# Patient Record
Sex: Male | Born: 1955
Health system: Southern US, Community
[De-identification: ages and names within clinical notes are randomized; demographics above are authoritative.]

## PROBLEM LIST (undated history)

## (undated) DIAGNOSIS — I10 Essential (primary) hypertension: Secondary | ICD-10-CM

## (undated) DIAGNOSIS — R931 Abnormal findings on diagnostic imaging of heart and coronary circulation: Secondary | ICD-10-CM

## (undated) DIAGNOSIS — E785 Hyperlipidemia, unspecified: Secondary | ICD-10-CM

## (undated) DIAGNOSIS — I2510 Atherosclerotic heart disease of native coronary artery without angina pectoris: Secondary | ICD-10-CM

## (undated) DIAGNOSIS — K219 Gastro-esophageal reflux disease without esophagitis: Secondary | ICD-10-CM

## (undated) DIAGNOSIS — K635 Polyp of colon: Secondary | ICD-10-CM

## (undated) HISTORY — PX: TONSILLECTOMY: SUR1361

## (undated) HISTORY — DX: Abnormal findings on diagnostic imaging of heart and coronary circulation: R93.1

## (undated) HISTORY — DX: Gastro-esophageal reflux disease without esophagitis: K21.9

## (undated) HISTORY — DX: Polyp of colon: K63.5

## (undated) HISTORY — DX: Hyperlipidemia, unspecified: E78.5

## (undated) HISTORY — DX: Atherosclerotic heart disease of native coronary artery without angina pectoris: I25.10

## (undated) HISTORY — DX: Essential (primary) hypertension: I10

---

## 1998-04-10 ENCOUNTER — Encounter: Payer: Self-pay | Admitting: Family Medicine

## 2000-03-19 ENCOUNTER — Encounter: Payer: Self-pay | Admitting: Family Medicine

## 2000-03-19 LAB — CONVERTED CEMR LAB: PSA: 1.5 ng/mL

## 2001-07-07 ENCOUNTER — Encounter: Payer: Self-pay | Admitting: Family Medicine

## 2003-12-03 ENCOUNTER — Encounter: Payer: Self-pay | Admitting: Family Medicine

## 2004-02-01 ENCOUNTER — Ambulatory Visit: Payer: Self-pay | Admitting: Family Medicine

## 2004-03-07 ENCOUNTER — Ambulatory Visit: Payer: Self-pay

## 2004-05-07 ENCOUNTER — Ambulatory Visit: Payer: Self-pay | Admitting: Family Medicine

## 2004-06-17 ENCOUNTER — Ambulatory Visit: Payer: Self-pay | Admitting: Family Medicine

## 2004-09-19 ENCOUNTER — Ambulatory Visit: Payer: Self-pay | Admitting: Family Medicine

## 2004-12-25 ENCOUNTER — Ambulatory Visit: Payer: Self-pay | Admitting: Family Medicine

## 2005-02-05 ENCOUNTER — Ambulatory Visit: Payer: Self-pay | Admitting: Family Medicine

## 2005-02-05 LAB — CONVERTED CEMR LAB: PSA: 1.98 ng/mL

## 2005-04-01 ENCOUNTER — Ambulatory Visit: Payer: Self-pay | Admitting: Family Medicine

## 2005-04-02 ENCOUNTER — Ambulatory Visit (HOSPITAL_COMMUNITY): Admission: RE | Admit: 2005-04-02 | Discharge: 2005-04-02 | Payer: Self-pay | Admitting: Family Medicine

## 2005-11-17 ENCOUNTER — Ambulatory Visit: Payer: Self-pay | Admitting: Family Medicine

## 2005-11-17 LAB — CONVERTED CEMR LAB: PSA: 2.27 ng/mL

## 2005-12-14 ENCOUNTER — Ambulatory Visit: Payer: Self-pay | Admitting: Family Medicine

## 2006-02-24 ENCOUNTER — Ambulatory Visit: Payer: Self-pay | Admitting: Family Medicine

## 2006-04-23 ENCOUNTER — Ambulatory Visit: Payer: Self-pay | Admitting: Family Medicine

## 2006-05-28 ENCOUNTER — Ambulatory Visit: Payer: Self-pay | Admitting: Family Medicine

## 2006-05-28 ENCOUNTER — Encounter: Payer: Self-pay | Admitting: Family Medicine

## 2006-05-28 DIAGNOSIS — K219 Gastro-esophageal reflux disease without esophagitis: Secondary | ICD-10-CM | POA: Insufficient documentation

## 2006-05-28 DIAGNOSIS — Z87891 Personal history of nicotine dependence: Secondary | ICD-10-CM | POA: Insufficient documentation

## 2006-05-28 DIAGNOSIS — I10 Essential (primary) hypertension: Secondary | ICD-10-CM | POA: Insufficient documentation

## 2006-05-28 DIAGNOSIS — E7800 Pure hypercholesterolemia, unspecified: Secondary | ICD-10-CM | POA: Insufficient documentation

## 2006-05-28 DIAGNOSIS — K649 Unspecified hemorrhoids: Secondary | ICD-10-CM | POA: Insufficient documentation

## 2006-05-28 LAB — CONVERTED CEMR LAB
ALT: 27 units/L (ref 0–40)
HDL: 43.6 mg/dL (ref >39.0)
LDL Cholesterol: 114 mg/dL — ABNORMAL HIGH (ref 0–99)
Total CHOL/HDL Ratio: 4.3
Triglycerides: 144 mg/dL (ref 0–149)
VLDL: 29 mg/dL (ref 0–40)

## 2006-11-22 ENCOUNTER — Ambulatory Visit: Payer: Self-pay | Admitting: Internal Medicine

## 2006-11-22 ENCOUNTER — Encounter (INDEPENDENT_AMBULATORY_CARE_PROVIDER_SITE_OTHER): Payer: Self-pay | Admitting: Internal Medicine

## 2006-11-25 ENCOUNTER — Ambulatory Visit: Payer: Self-pay | Admitting: Family Medicine

## 2008-01-11 ENCOUNTER — Ambulatory Visit: Payer: Self-pay | Admitting: Family Medicine

## 2008-01-13 LAB — CONVERTED CEMR LAB
ALT: 26 units/L (ref 0–53)
Albumin: 4.2 g/dL (ref 3.5–5.2)
Basophils Relative: 0.3 % (ref 0.0–3.0)
CO2: 30 meq/L (ref 19–32)
Calcium: 9.6 mg/dL (ref 8.4–10.5)
Cholesterol: 219 mg/dL (ref 0–200)
Creatinine, Ser: 1.2 mg/dL (ref 0.4–1.5)
Direct LDL: 129.3 mg/dL
GFR calc Af Amer: 83 mL/min
Glucose, Bld: 98 mg/dL (ref 70–99)
HCT: 42.1 % (ref 39.0–52.0)
Hemoglobin: 15.1 g/dL (ref 13.0–17.0)
Lymphocytes Relative: 31.7 % (ref 12.0–46.0)
MCHC: 35.7 g/dL (ref 30.0–36.0)
Monocytes Absolute: 0.7 10*3/uL (ref 0.1–1.0)
Monocytes Relative: 9.3 % (ref 3.0–12.0)
Neutro Abs: 4.1 10*3/uL (ref 1.4–7.7)
PSA: 3.26 ng/mL (ref 0.10–4.00)
RBC: 4.49 M/uL (ref 4.22–5.81)
RDW: 11.6 % (ref 11.5–14.6)
Sodium: 139 meq/L (ref 135–145)
TSH: 0.69 microintl units/mL (ref 0.35–5.50)
Total CHOL/HDL Ratio: 5.3
Total Protein: 6.7 g/dL (ref 6.0–8.3)
VLDL: 26 mg/dL (ref 0–40)

## 2008-02-23 ENCOUNTER — Ambulatory Visit: Payer: Self-pay | Admitting: Gastroenterology

## 2008-02-24 HISTORY — PX: COLONOSCOPY W/ POLYPECTOMY: SHX1380

## 2008-03-02 ENCOUNTER — Encounter: Payer: Self-pay | Admitting: Gastroenterology

## 2008-03-02 ENCOUNTER — Ambulatory Visit: Payer: Self-pay | Admitting: Gastroenterology

## 2008-03-06 ENCOUNTER — Encounter: Payer: Self-pay | Admitting: Gastroenterology

## 2008-03-07 ENCOUNTER — Telehealth: Payer: Self-pay | Admitting: Gastroenterology

## 2008-03-07 ENCOUNTER — Ambulatory Visit: Payer: Self-pay | Admitting: Gastroenterology

## 2008-03-07 LAB — CONVERTED CEMR LAB
Basophils Absolute: 0.1 10*3/uL (ref 0.0–0.1)
Lymphocytes Relative: 29.8 % (ref 12.0–46.0)
MCHC: 35 g/dL (ref 30.0–36.0)
Monocytes Relative: 9.6 % (ref 3.0–12.0)
Neutro Abs: 5.2 10*3/uL (ref 1.4–7.7)
Neutrophils Relative %: 56.8 % (ref 43.0–77.0)
Platelets: 228 10*3/uL (ref 150–400)
RDW: 11.7 % (ref 11.5–14.6)

## 2008-05-18 ENCOUNTER — Ambulatory Visit: Payer: Self-pay | Admitting: Family Medicine

## 2008-07-11 ENCOUNTER — Encounter (INDEPENDENT_AMBULATORY_CARE_PROVIDER_SITE_OTHER): Payer: Self-pay | Admitting: *Deleted

## 2008-08-15 ENCOUNTER — Ambulatory Visit: Payer: Self-pay | Admitting: Family Medicine

## 2008-08-16 LAB — CONVERTED CEMR LAB
Cholesterol: 215 mg/dL — ABNORMAL HIGH (ref 0–200)
Direct LDL: 140.1 mg/dL
HDL: 41.1 mg/dL (ref >39.00)
Triglycerides: 172 mg/dL — ABNORMAL HIGH (ref 0.0–149.0)
VLDL: 34.4 mg/dL (ref 0.0–40.0)

## 2008-09-03 ENCOUNTER — Ambulatory Visit: Payer: Self-pay | Admitting: Family Medicine

## 2008-10-08 ENCOUNTER — Ambulatory Visit: Payer: Self-pay | Admitting: Family Medicine

## 2008-10-11 LAB — CONVERTED CEMR LAB
ALT: 31 units/L (ref 0–53)
HDL: 39.6 mg/dL (ref >39.00)
LDL Cholesterol: 135 mg/dL — ABNORMAL HIGH (ref 0–99)
Total Bilirubin: 0.9 mg/dL (ref 0.3–1.2)
Total Protein: 6.8 g/dL (ref 6.0–8.3)
VLDL: 24.2 mg/dL (ref 0.0–40.0)

## 2008-10-25 ENCOUNTER — Encounter (INDEPENDENT_AMBULATORY_CARE_PROVIDER_SITE_OTHER): Payer: Self-pay | Admitting: *Deleted

## 2008-11-26 ENCOUNTER — Ambulatory Visit: Payer: Self-pay | Admitting: Family Medicine

## 2008-11-29 LAB — CONVERTED CEMR LAB
ALT: 31 units/L (ref 0–53)
AST: 21 units/L (ref 0–37)
Cholesterol: 171 mg/dL (ref 0–200)
HDL: 38.8 mg/dL — ABNORMAL LOW (ref >39.00)

## 2009-03-04 ENCOUNTER — Ambulatory Visit: Payer: Self-pay | Admitting: Family Medicine

## 2009-03-05 LAB — CONVERTED CEMR LAB
Cholesterol: 191 mg/dL (ref 0–200)
Total CHOL/HDL Ratio: 4
Triglycerides: 222 mg/dL — ABNORMAL HIGH (ref 0.0–149.0)
VLDL: 44.4 mg/dL — ABNORMAL HIGH (ref 0.0–40.0)

## 2009-08-19 ENCOUNTER — Ambulatory Visit: Payer: Self-pay | Admitting: Family Medicine

## 2009-08-19 LAB — CONVERTED CEMR LAB
ALT: 33 units/L (ref 0–53)
AST: 25 units/L (ref 0–37)
Albumin: 4.4 g/dL (ref 3.5–5.2)
Alkaline Phosphatase: 39 units/L (ref 39–117)
BUN: 23 mg/dL (ref 6–23)
Basophils Absolute: 0 10*3/uL (ref 0.0–0.1)
Basophils Relative: 0.5 % (ref 0.0–3.0)
Bilirubin, Direct: 0.2 mg/dL (ref 0.0–0.3)
CO2: 28 meq/L (ref 19–32)
Calcium: 9.4 mg/dL (ref 8.4–10.5)
Chloride: 103 meq/L (ref 96–112)
Cholesterol: 202 mg/dL — ABNORMAL HIGH (ref 0–200)
Creatinine, Ser: 1.1 mg/dL (ref 0.4–1.5)
Direct LDL: 124.5 mg/dL
Eosinophils Absolute: 0.3 10*3/uL (ref 0.0–0.7)
Eosinophils Relative: 2.6 % (ref 0.0–5.0)
GFR calc non Af Amer: 76.47 mL/min (ref >60)
Glucose, Bld: 97 mg/dL (ref 70–99)
HCT: 41.1 % (ref 39.0–52.0)
HDL: 40.9 mg/dL (ref >39.00)
Hemoglobin: 14.4 g/dL (ref 13.0–17.0)
Lymphocytes Relative: 23.4 % (ref 12.0–46.0)
Lymphs Abs: 2.2 10*3/uL (ref 0.7–4.0)
MCHC: 35 g/dL (ref 30.0–36.0)
MCV: 95.9 fL (ref 78.0–100.0)
Monocytes Absolute: 0.9 10*3/uL (ref 0.1–1.0)
Monocytes Relative: 9.4 % (ref 3.0–12.0)
Neutro Abs: 6.1 10*3/uL (ref 1.4–7.7)
Neutrophils Relative %: 64.1 % (ref 43.0–77.0)
PSA: 1.69 ng/mL (ref 0.10–4.00)
Platelets: 244 10*3/uL (ref 150.0–400.0)
Potassium: 4.5 meq/L (ref 3.5–5.1)
RBC: 4.29 M/uL (ref 4.22–5.81)
RDW: 12.7 % (ref 11.5–14.6)
Sodium: 139 meq/L (ref 135–145)
TSH: 0.96 microintl units/mL (ref 0.35–5.50)
Total Bilirubin: 1 mg/dL (ref 0.3–1.2)
Total CHOL/HDL Ratio: 5
Total Protein: 6.9 g/dL (ref 6.0–8.3)
Triglycerides: 169 mg/dL — ABNORMAL HIGH (ref 0.0–149.0)
VLDL: 33.8 mg/dL (ref 0.0–40.0)
WBC: 9.5 10*3/uL (ref 4.5–10.5)

## 2009-09-13 ENCOUNTER — Ambulatory Visit: Payer: Self-pay | Admitting: Family Medicine

## 2009-12-12 ENCOUNTER — Encounter: Payer: Self-pay | Admitting: Family Medicine

## 2010-01-27 ENCOUNTER — Ambulatory Visit: Payer: Self-pay | Admitting: Family Medicine

## 2010-01-27 DIAGNOSIS — R059 Cough, unspecified: Secondary | ICD-10-CM | POA: Insufficient documentation

## 2010-01-27 DIAGNOSIS — R05 Cough: Secondary | ICD-10-CM

## 2010-02-28 ENCOUNTER — Ambulatory Visit
Admission: RE | Admit: 2010-02-28 | Discharge: 2010-02-28 | Payer: Self-pay | Source: Home / Self Care | Attending: Family Medicine | Admitting: Family Medicine

## 2010-02-28 ENCOUNTER — Encounter: Payer: Self-pay | Admitting: Family Medicine

## 2010-02-28 DIAGNOSIS — R079 Chest pain, unspecified: Secondary | ICD-10-CM | POA: Insufficient documentation

## 2010-03-04 ENCOUNTER — Ambulatory Visit
Admission: RE | Admit: 2010-03-04 | Discharge: 2010-03-04 | Payer: Self-pay | Source: Home / Self Care | Attending: Cardiovascular Disease | Admitting: Cardiovascular Disease

## 2010-03-04 DIAGNOSIS — R9431 Abnormal electrocardiogram [ECG] [EKG]: Secondary | ICD-10-CM | POA: Insufficient documentation

## 2010-03-05 ENCOUNTER — Encounter: Payer: Self-pay | Admitting: Cardiovascular Disease

## 2010-03-11 ENCOUNTER — Encounter (INDEPENDENT_AMBULATORY_CARE_PROVIDER_SITE_OTHER): Payer: Self-pay | Admitting: *Deleted

## 2010-03-18 ENCOUNTER — Ambulatory Visit
Admission: RE | Admit: 2010-03-18 | Discharge: 2010-03-18 | Payer: Self-pay | Source: Home / Self Care | Attending: Family Medicine | Admitting: Family Medicine

## 2010-03-18 ENCOUNTER — Other Ambulatory Visit: Payer: Self-pay | Admitting: Family Medicine

## 2010-03-18 LAB — AST: AST: 25 U/L (ref 0–37)

## 2010-03-18 LAB — LIPID PANEL
LDL Cholesterol: 115 mg/dL — ABNORMAL HIGH (ref 0–99)
Total CHOL/HDL Ratio: 5
Triglycerides: 185 mg/dL — ABNORMAL HIGH (ref 0.0–149.0)

## 2010-03-24 ENCOUNTER — Ambulatory Visit
Admission: RE | Admit: 2010-03-24 | Discharge: 2010-03-24 | Payer: Self-pay | Source: Home / Self Care | Attending: Family Medicine | Admitting: Family Medicine

## 2010-03-24 ENCOUNTER — Telehealth: Payer: Self-pay | Admitting: Cardiovascular Disease

## 2010-03-25 ENCOUNTER — Telehealth: Payer: Self-pay | Admitting: Family Medicine

## 2010-03-25 NOTE — Assessment & Plan Note (Signed)
Summary: 3 months follow up /lsf   Vital Signs:  Patient profile:   55 year old male Weight:      233 pounds BMI:     45.67 Temp:     97.5 degrees F oral Pulse rate:   72 / minute Pulse rhythm:   regular BP sitting:   132 / 80  (left arm) Cuff size:   large  Vitals Entered By: Lowella Petties CMA (March 04, 2009 8:19 AM) CC: 3 month follow up   History of Present Illness: here for f/u for HTN and lipids has been feeling ok overall   working a lot - out on the road  still nervous about job security - and economy problems   will do labs today-- could not come on friday had imp with pravachol- in fall with LDL 111  diet - has been  a little better (still hard when he is traveling on the road) is avoiding greasy foods/ beef and fast food -- is doing well with that most of the time picks grilled items and salads   some exercise when he can / when he is home-- esp on the weekends   bp is well controlled -- 132/80 today  no ha or other symptoms - no changes at all  tries to walk for exercise  needs px written for flex care spending- asa and zantac (no problems with those)     Allergies: 1)  ! Lipitor 2)  ! Mentax 3)  ! * Chantix  Past History:  Past Medical History: Last updated: 03/17/2008 GERD Hypertension hyperlipidemia  hemorroids  colon polyp/ non adenoma  Past Surgical History: Last updated: 03/17/2008 Stress myoview negative 1/06 ABI's normal 2/07 colonoscopy polyp (non adenoma) (1/10)- re check 10 y  Family History: Last updated: 05/28/2006 strong fam hx of CAD father died of CAD, DM cousin-sudden death (?cardiac)  Social History: Last updated: 01/11/2008 quit smoking 1/07 Patient is a former smoker.  married  regular exercise at gym   Risk Factors: Smoking Status: quit (05/28/2006)  Review of Systems General:  Denies fatigue, fever, loss of appetite, and malaise. Eyes:  Denies blurring. CV:  Denies chest pain or discomfort and  palpitations. Resp:  Denies cough and shortness of breath. GI:  Denies change in bowel habits. MS:  Complains of joint pain; denies muscle aches; still struggling with foot problems- on mobic for that . Derm:  Denies poor wound healing and rash. Neuro:  Denies headaches, numbness, and tingling. Psych:  mood is ok- but frustrated with work at times. Endo:  Denies cold intolerance, excessive thirst, excessive urination, and heat intolerance. Heme:  Denies abnormal bruising and bleeding.  Physical Exam  General:  overweight but generally well appearing  Head:  normocephalic, atraumatic, and no abnormalities observed.   Eyes:  vision grossly intact, pupils equal, pupils round, and pupils reactive to light.   Neck:  supple with full rom and no masses or thyromegally, no JVD or carotid bruit  Lungs:  Normal respiratory effort, chest expands symmetrically. Lungs are clear to auscultation, no crackles or wheezes. Heart:  Normal rate and regular rhythm. S1 and S2 normal without gallop, murmur, click, rub or other extra sounds. Msk:  No deformity or scoliosis noted of thoracic or lumbar spine.   Pulses:  R and L carotid,radial,femoral,dorsalis pedis and posterior tibial pulses are full and equal bilaterally Extremities:  No clubbing, cyanosis, edema, or deformity noted with normal full range of motion of all joints.  Neurologic:  sensation intact to light touch, gait normal, and DTRs symmetrical and normal.   Skin:  Intact without suspicious lesions or rashes ruddy complexion baseline Cervical Nodes:  No lymphadenopathy noted Psych:  normal affect, talkative and pleasant    Impression & Recommendations:  Problem # 1:  HYPERCHOLESTEROLEMIA (ICD-272.0) Assessment Unchanged  suspect will be stable to slt imp- with better diet disc sat fats in diet  plan to continue pravachol f/u 6 mo  His updated medication list for this problem includes:    Pravachol 20 Mg Tabs (Pravastatin sodium) .Marland Kitchen...  Take one by mouth daily  Orders: Venipuncture (32355) TLB-Lipid Panel (80061-LIPID) TLB-AST (SGOT) (84450-SGOT) TLB-ALT (SGPT) (84460-ALT)  Labs Reviewed: SGOT: 21 (11/26/2008)   SGPT: 31 (11/26/2008)   HDL:38.80 (11/26/2008), 39.60 (10/08/2008)  LDL:111 (11/26/2008), 135 (10/08/2008)  Chol:171 (11/26/2008), 199 (10/08/2008)  Trig:107.0 (11/26/2008), 121.0 (10/08/2008)  Problem # 2:  HYPERTENSION (ICD-401.9) Assessment: Unchanged  bp remains in fair control with ziac  disc lifestyle habits- to fit in exercise when he can  f/u for PE and lab in 6 mo  His updated medication list for this problem includes:    Ziac 2.5-6.25 Mg Tabs (Bisoprolol-hydrochlorothiazide) .Marland Kitchen... Take 1 tablet by mouth once a day  BP today: 132/80 Prior BP: 120/84 (09/03/2008)  Labs Reviewed: K+: 4.2 (01/11/2008) Creat: : 1.2 (01/11/2008)   Chol: 171 (11/26/2008)   HDL: 38.80 (11/26/2008)   LDL: 111 (11/26/2008)   TG: 107.0 (11/26/2008)  Complete Medication List: 1)  Ziac 2.5-6.25 Mg Tabs (Bisoprolol-hydrochlorothiazide) .... Take 1 tablet by mouth once a day 2)  Aspirin 325 Mg Tabs (Aspirin) .Marland Kitchen.. 1 by mouth once daily with food 3)  Zantac 75 75 Mg Tabs (Ranitidine hcl) .... Take 1 tablet by mouth once a day 4)  Vitamin C 1000 Mg Tabs (Ascorbic acid) .... Take 1 tablet by mouth once a day 5)  Fish Oil Oil (Fish oil) .... Take 1 tablet by mouth once a day 6)  Meloxicam 15 Mg Tabs (Meloxicam) .... Take 1 tablet by mouth once a day as needed 7)  Pravachol 20 Mg Tabs (Pravastatin sodium) .... Take one by mouth daily  Patient Instructions: 1)  keep working on healthy diet and exercise  2)  labs today  3)  no change in medicines  4)  schedule fasting labs and then f/u for PE in 6 months wellness/ lipid/psa v70.0 Prescriptions: ZANTAC 75 75 MG  TABS (RANITIDINE HCL) Take 1 tablet by mouth once a day  #60 x 11   Entered and Authorized by:   Judith Part MD   Signed by:   Judith Part MD on 03/04/2009    Method used:   Print then Give to Patient   RxID:   [id number] ASPIRIN 325 MG TABS (ASPIRIN) 1 by mouth once daily with food  #30 x 11   Entered and Authorized by:   Judith Part MD   Signed by:   Judith Part MD on 03/04/2009   Method used:   Print then Give to Patient   RxID:   [id number] PRAVACHOL 20 MG TABS (PRAVASTATIN SODIUM) take one by mouth daily  #30 x 11   Entered and Authorized by:   Judith Part MD   Signed by:   Judith Part MD on 03/04/2009   Method used:   Print then Give to Patient   RxID:   [id number] ZIAC 2.5-6.25 MG  TABS (BISOPROLOL-HYDROCHLOROTHIAZIDE) Take 1 tablet by mouth once a day  #  30 x 11   Entered and Authorized by:   Judith Part MD   Signed by:   Judith Part MD on 03/04/2009   Method used:   Print then Give to Patient   RxID:   [id number]   Prior Medications (reviewed today): ZIAC 2.5-6.25 MG  TABS (BISOPROLOL-HYDROCHLOROTHIAZIDE) Take 1 tablet by mouth once a day ZANTAC 75 75 MG  TABS (RANITIDINE HCL) Take 1 tablet by mouth once a day VITAMIN C 1000 MG TABS (ASCORBIC ACID) Take 1 tablet by mouth once a day FISH OIL   OIL (FISH OIL) Take 1 tablet by mouth once a day MELOXICAM 15 MG TABS (MELOXICAM) Take 1 tablet by mouth once a day as needed PRAVACHOL 20 MG TABS (PRAVASTATIN SODIUM) take one by mouth daily Current Allergies: ! LIPITOR ! MENTAX ! * CHANTIX

## 2010-03-25 NOTE — Assessment & Plan Note (Signed)
Summary: CPX/RBH   Vital Signs:  Patient profile:   55 year old male Height:      69.75 inches Weight:      234 pounds BMI:     33.94 Temp:     97.8 degrees F oral Pulse rate:   68 / minute Pulse rhythm:   regular BP sitting:   128 / 80  (left arm) Cuff size:   large  Vitals Entered By: Lewanda Rife LPN (September 13, 2009 9:46 AM) CC: CPX   History of Present Illness: here for wellnes exam and to rev chronic medical problems   is doing ok - just had a bad am   was on nsaids for a while-mobic  went back to Dr Penni Bombard at [facility] orth[facility]  changed him to celebrex - has been on that a few days -- stomach is a little more upset  has had advil in the past  takes it for toe pain in r foot - chronic pain  got orthotics for his shoes -- they are uncomfortable  does not have f/u with Dr Penni Bombard surgery may be an option down the road  wt is stable with bmi of 33  bp is well controlled with bp of 128/80 today- no change in HTN on meds  lipids are up a bit with trig 169 and HDL 40 and LDL 124 (up from 105) is on pravachol no missed doses eating has not been great - because of travel most of the time  eats better when he is at home  exercises better at home as well -- toe is making it hard to swell   last colonosc with polyp 1/10- not due yet   Td 09 up to date  psa nl at 1.69 prostate - no trouble urinating , nocturia is about once  not any trouble passing uine    Allergies: 1)  ! Lipitor 2)  ! Mentax 3)  ! * Chantix  Past History:  Past Medical History: Last updated: 03/17/2008 GERD Hypertension hyperlipidemia  hemorroids  colon polyp/ non adenoma  Past Surgical History: Last updated: 03/17/2008 Stress myoview negative 1/06 ABI's normal 2/07 colonoscopy polyp (non adenoma) (1/10)- re check 10 y  Family History: Last updated: 05/28/2006 strong fam hx of CAD father died of CAD, DM cousin-sudden death (?cardiac)  Social History: Last updated: 01/11/2008 quit  smoking 1/07 Patient is a former smoker.  married  regular exercise at gym   Risk Factors: Smoking Status: quit (05/28/2006)  Review of Systems General:  Complains of fatigue; denies loss of appetite and malaise. Eyes:  Denies blurring and eye irritation. CV:  Denies chest pain or discomfort, palpitations, and shortness of breath with exertion. Resp:  Denies cough, shortness of breath, and wheezing. GI:  Denies abdominal pain, change in bowel habits, indigestion, and nausea. GU:  Complains of nocturia; denies urinary frequency and urinary hesitancy. MS:  Complains of joint pain, joint swelling, and stiffness; denies joint redness, muscle aches, cramps, and muscle weakness. Derm:  Denies itching, lesion(s), poor wound healing, and rash. Neuro:  Denies numbness and tingling. Psych:  Denies anxiety and depression; is very stressed in general . Endo:  Denies cold intolerance, excessive thirst, excessive urination, and heat intolerance. Heme:  Denies abnormal bruising and bleeding.  Physical Exam  General:  overweight but generally well appearing  Head:  normocephalic, atraumatic, and no abnormalities observed.   Eyes:  vision grossly intact, pupils equal, pupils round, and pupils reactive to light.   Ears:  R ear normal and L ear normal.  - scant cerumen Nose:  no nasal discharge.   Mouth:  pharynx pink and moist.   Neck:  supple with full rom and no masses or thyromegally, no JVD or carotid bruit  Chest Wall:  No deformities, masses, tenderness or gynecomastia noted. Lungs:  Normal respiratory effort, chest expands symmetrically. Lungs are clear to auscultation, no crackles or wheezes. Heart:  Normal rate and regular rhythm. S1 and S2 normal without gallop, murmur, click, rub or other extra sounds. Abdomen:  Bowel sounds positive,abdomen soft and non-tender without masses, organomegaly or hernias noted. no acute joint changes Rectal:  No external abnormalities noted. Normal sphincter  tone. No rectal masses or tenderness. Prostate:  Prostate gland firm and smooth, no enlargement, nodularity, tenderness, mass, asymmetry or induration. Msk:  R 2nd toe- some swelling over top of joint- looks like hammar toe  no other acute joint changes  Pulses:  R and L carotid,radial,femoral,dorsalis pedis and posterior tibial pulses are full and equal bilaterally Extremities:  No clubbing, cyanosis, edema, or deformity noted with normal full range of motion of all joints.   Neurologic:  sensation intact to light touch, gait normal, and DTRs symmetrical and normal.   Skin:  Intact without suspicious lesions or rashes ruddy complexion with some tan Cervical Nodes:  No lymphadenopathy noted Inguinal Nodes:  No significant adenopathy Psych:  nl affect - seems mildly fatigued today   Impression & Recommendations:  Problem # 1:  HEALTH MAINTENANCE EXAM (ICD-V70.0) Assessment Comment Only reviewed health habits including diet, exercise and skin cancer prevention reviewed health maintenance list and family history disc working on low chol diet rev labs in detail  Problem # 2:  SPECIAL SCREENING MALIGNANT NEOPLASM OF PROSTATE (ICD-V76.44) Assessment: Unchanged psa nl and nl dre today without symptoms  Problem # 3:  HYPERTENSION (ICD-401.9) Assessment: Unchanged  good control without change  disc healthy diet (low simple sugar/ choose complex carbs/ low sat fat) diet and exercise in detail  His updated medication list for this problem includes:    Ziac 2.5-6.25 Mg Tabs (Bisoprolol-hydrochlorothiazide) .Marland Kitchen... Take 1 tablet by mouth once a day  BP today: 128/80 Prior BP: 132/80 (03/04/2009)  Labs Reviewed: K+: 4.5 (08/19/2009) Creat: : 1.1 (08/19/2009)   Chol: 202 (08/19/2009)   HDL: 40.90 (08/19/2009)   LDL: 111 (11/26/2008)   TG: 169.0 (08/19/2009)  Problem # 4:  HYPERCHOLESTEROLEMIA (ICD-272.0) this is up a bit with worse diet rev low sat fat diet in detail rev labs  re check  in 6 mo on diet and pravachol His updated medication list for this problem includes:    Pravachol 20 Mg Tabs (Pravastatin sodium) .Marland Kitchen... Take one by mouth daily  Complete Medication List: 1)  Ziac 2.5-6.25 Mg Tabs (Bisoprolol-hydrochlorothiazide) .... Take 1 tablet by mouth once a day 2)  Aspirin 325 Mg Tabs (Aspirin) .Marland Kitchen.. 1 by mouth once daily with food 3)  Zantac 75 75 Mg Tabs (Ranitidine hcl) .... Take 1 tablet by mouth once a day 4)  Vitamin C 1000 Mg Tabs (Ascorbic acid) .... Take 1 tablet by mouth once a day 5)  Fish Oil Oil (Fish oil) .... Take 1 tablet by mouth once a day 6)  Pravachol 20 Mg Tabs (Pravastatin sodium) .... Take one by mouth daily 7)  Celebrex 200 Mg Caps (Celecoxib)  Patient Instructions: 1)  no change in medicines  2)  keep working on diet and exercise the best you can  3)  you  can raise your HDL (good cholesterol) by increasing exercise and eating omega 3 fatty acid supplement like fish oil or flax seed oil over the counter 4)  you can lower LDL (bad cholesterol) by limiting saturated fats in diet like red meat, fried foods, egg yolks, fatty breakfast meats, high fat dairy products and shellfish 5)  schedule fasting labs in 6 months lipid/ast/alt 272 and then follow up   Current Allergies (reviewed today): ! LIPITOR ! MENTAX ! * CHANTIX

## 2010-03-25 NOTE — Assessment & Plan Note (Signed)
Summary: COLD/CONGESTON/DLO   Vital Signs:  Patient profile:   55 year old male Height:      69.75 inches Weight:      234.50 pounds BMI:     34.01 O2 Sat:      95 % on Room air Temp:     98.9 degrees F oral Pulse rate:   84 / minute Pulse rhythm:   regular Resp:     16 per minute BP sitting:   146 / 84  (left arm) Cuff size:   large  Vitals Entered By: Delilah Shan CMA (AAMA) (January 27, 2010 8:22 AM)  O2 Flow:  Room air CC: Cold, chest congestion, hard to breathe,  pulled muscles in back from coughing, extremely weak.   History of Present Illness: ST after Thanksgiving.  Since then with voice change, chills, cough, and sweats.  Since Saturday night, patient has been coughing more at night.  Pulled muscles in back with coughing.  +Sputum.  Rested yesterday.  Tried otc meds in the meantime w/o much help.  No fevers known but sweaty per patient.  Fatiged.  No ear, facial pain.  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  Quit smoking.  Had flu shot.    Current Medications (verified): 1)  Ziac 2.5-6.25 Mg  Tabs (Bisoprolol-Hydrochlorothiazide) .... Take 1 Tablet By Mouth Once A Day 2)  Zantac 75 75 Mg  Tabs (Ranitidine Hcl) .... Take 1 Tablet By Mouth Once A Day 3)  Vitamin C 1000 Mg Tabs (Ascorbic Acid) .... Take 1 Tablet By Mouth Once A Day 4)  Fish Oil   Oil (Fish Oil) .... Take 1 Tablet By Mouth Once A Day 5)  Pravachol 20 Mg Tabs (Pravastatin Sodium) .... Take One By Mouth Daily 6)  Celebrex 200 Mg Caps (Celecoxib)  Allergies: 1)  ! Lipitor 2)  ! Mentax 3)  ! * Chantix  Review of Systems       See HPI.  Otherwise negative.    Physical Exam  General:  GEN: nad, alert and oriented HEENT: mucous membranes moist, TM w/o erythema, nasal epithelium injected, OP with cobblestoning NECK: supple w/o LA CV: rrr. PULM: ctab except for faint crackles in LUL, no inc wob, no wheeze ABD: soft, +bs EXT: no edema    Impression & Recommendations:  Problem # 1:  COUGH (ICD-786.2) I have  concern for early LUL process.  Start antibiotics and cough suppressant for comfort and follow up as needed.  If short of breath, then to ER.  He understands.  No need to image as it wouldn't change mgmt today.  D/w patient and he understood. follow up as needed.  Nontoxic and okay for outpatient follow up.   Complete Medication List: 1)  Ziac 2.5-6.25 Mg Tabs (Bisoprolol-hydrochlorothiazide) .... Take 1 tablet by mouth once a day 2)  Zantac 75 75 Mg Tabs (Ranitidine hcl) .... Take 1 tablet by mouth once a day 3)  Vitamin C 1000 Mg Tabs (Ascorbic acid) .... Take 1 tablet by mouth once a day 4)  Fish Oil Oil (Fish oil) .... Take 1 tablet by mouth once a day 5)  Pravachol 20 Mg Tabs (Pravastatin sodium) .... Take one by mouth daily 6)  Celebrex 200 Mg Caps (Celecoxib) 7)  Zithromax 250 Mg Tabs (Azithromycin) .... 2 by mouth today and then 1 by mouth once daily for 4 days 8)  Hydromet 5-1.5 Mg/62ml Syrp (Hydrocodone-homatropine) .... 5ml by mouth q6-8 hours for cough, sedation caution  Patient Instructions: 1)  Get  plenty of rest, drink lots of clear liquids, and use Tylenol or Ibuprofen for fever and comfort. If you get short of breath, go to the ER.  Take care.  This should gradually get better, but the cough may persist much longer than you would prefer.  Prescriptions: HYDROMET 5-1.5 MG/5ML SYRP (HYDROCODONE-HOMATROPINE) 5ml by mouth q6-8 hours for cough, sedation caution  #8oz x 0   Entered and Authorized by:   Crawford Givens MD   Signed by:   Crawford Givens MD on 01/27/2010   Method used:   Print then Give to Patient   RxID:   [id number] ZITHROMAX 250 MG TABS (AZITHROMYCIN) 2 by mouth today and then 1 by mouth once daily for 4 days  #6 x 0   Entered and Authorized by:   Crawford Givens MD   Signed by:   Crawford Givens MD on 01/27/2010   Method used:   Print then Give to Patient   RxID:   [id number]    Orders Added: 1)  Est. Patient Level III [28413]    Current Allergies  (reviewed today): ! LIPITOR ! MENTAX ! * CHANTIX

## 2010-03-25 NOTE — Letter (Signed)
Summary: [facility]  [facility]   Imported By: Lanelle Bal 12/24/2009 15:44:13  _____________________________________________________________________  External Attachment:    Type:   Image     Comment:   External Document

## 2010-03-27 NOTE — Letter (Signed)
Summary: [place] No Show Letter  [place] at [facility]  [address] [place], [place] [address]   Phone: [phone]  Fax: [phone]    03/11/2010 MRN: [id number]  Richard Baldwin [address] [place], [place]  [address]   Dear Richard Baldwin,   Our records indicate that you missed your scheduled appointment with _____lABORATORY________________ on _1.16.2012___________.  Please contact this office to reschedule your appointment as soon as possible.  It is important that you keep your scheduled appointments with your physician, so we can provide you the best care possible.  Please be advised that there may be a charge for "no show" appointments.    Sincerely,   [place] at [facility]

## 2010-03-27 NOTE — Letter (Signed)
Summary: [place] No Show Letter  [place] at [facility]  [address] [place], [place] [address]   Phone: [phone]  Fax: [phone]    03/11/2010 MRN: [id number]  Richard Baldwin [address] [place], [place]  [address]   Dear Richard Baldwin,   Our records indicate that you missed your scheduled appointment with __LABORATORY___________________ on _1.16.2012___________.  Please contact this office to reschedule your appointment as soon as possible.  It is important that you keep your scheduled appointments with your physician, so we can provide you the best care possible.  Please be advised that there may be a charge for "no show" appointments.    Sincerely,    at [facility]

## 2010-03-27 NOTE — Assessment & Plan Note (Signed)
Summary: NP6/AMD      Allergies Added:   Visit Type:  Initial Consult Primary Provider:  Roxy Manns, MD  CC:  c/o chest pains during exercise. denies SOB and palpitations..  History of Present Illness: Richard Baldwin is a pleasant 55 year old gentleman with a long smoking history, stopped in 2007, hypertension, hyperlipidemia with recent severe upper respiratory infection starting at the end of November treated with antibiotics who presents by referral for evaluation of chest pain and abnormal EKG.  He reports at the end of November after Thanksgiving, he developed a significant upper respiratory infection. After antibiotics, he has slowly started to get better. He is exercising. When he exercises on flat surfaces, he does well. When he climbs hills he has some tightness in his chest that he describes as the skin being pulled in his mediastinal region. Symptoms resolve after moving back to a flat surface though he is able to continue exercising. He continues to have a low-grade cough. Recently at night, he has woken with some sputum production.  Chest x-ray on January 6 is concerning for peribronchial thickening and bronchitis.  EKG today shows normal sinus rhythm with rate 59 beats per minute, nonspecific T-wave changes in leads 3, aVF. The T wave abnormality was seen previously from an EKG in 2003 and last week.  Cholesterol from June shows total cholesterol close to 200, LDL 124  Current Medications (verified): 1)  Ziac 2.5-6.25 Mg  Tabs (Bisoprolol-Hydrochlorothiazide) .... Take 1 Tablet By Mouth Once A Day 2)  Zantac 75 75 Mg  Tabs (Ranitidine Hcl) .... Take 1 Tablet By Mouth Once A Day 3)  Vitamin C 1000 Mg Tabs (Ascorbic Acid) .... Take 1 Tablet By Mouth Once A Day 4)  Fish Oil   Oil (Fish Oil) .... Take 1 Tablet By Mouth Once A Day 5)  Pravachol 20 Mg Tabs (Pravastatin Sodium) .... Take One By Mouth Daily 6)  Celebrex 200 Mg Caps (Celecoxib) 7)  Aspir-Low 81 Mg Tbec (Aspirin) .Marland Kitchen..  1 By Mouth Once Daily  Allergies (verified): 1)  ! Lipitor 2)  ! Mentax 3)  ! * Chantix  Past History:  Past Medical History: Last updated: 02/28/2010 GERD Hypertension hyperlipidemia  hemorroids  colon polyp/ non adenoma  Past Surgical History: Last updated: 03/17/2008 Stress myoview negative 1/06 ABI's normal 2/07 colonoscopy polyp (non adenoma) (1/10)- re check 10 y  Family History: Last updated: 05/28/2006 strong fam hx of CAD father died of CAD, DM cousin-sudden death (?cardiac)  Social History: Last updated: 01/11/2008 quit smoking 1/07 Patient is a former smoker.  married  regular exercise at gym   Risk Factors: Smoking Status: quit (05/28/2006)  Review of Systems       The patient complains of chest pain and dyspnea on exertion.  The patient denies fever, weight loss, weight gain, vision loss, decreased hearing, hoarseness, syncope, peripheral edema, prolonged cough, abdominal pain, incontinence, muscle weakness, depression, and enlarged lymph nodes.    Vital Signs:  Patient profile:   55 year old male Height:      69.75 inches Weight:      233.50 pounds BMI:     33.87 Pulse rate:   59 / minute BP sitting:   142 / 72  (left arm) Cuff size:   regular  Vitals Entered By: Lysbeth Galas CMA (March 04, 2010 2:28 PM)  Physical Exam  General:  Well developed, well nourished, in no acute distress. Head:  normocephalic and atraumatic Neck:  Neck supple, no JVD. No  masses, thyromegaly or abnormal cervical nodes. Lungs:  Clear bilaterally to auscultation and percussion. Heart:  Non-displaced PMI, chest non-tender; regular rate and rhythm, S1, S2 without murmurs, rubs or gallops. Carotid upstroke normal, no bruit. Pedals normal pulses. No edema, no varicosities. Abdomen:  Bowel sounds positive; abdomen soft and non-tender without masses Msk:  Back normal, normal gait. Muscle strength and tone normal. Pulses:  pulses normal in all 4  extremities Extremities:  No clubbing or cyanosis. Neurologic:  Alert and oriented x 3. Skin:  Intact without lesions or rashes. Psych:  Normal affect.   Impression & Recommendations:  Problem # 1:  CHEST PAIN (ICD-786.50) etiology of chest pain is concerning for postinflammatory changes after his upper respiratory infection. He continues to have a low-grade cough. He is able to exercise on flat grade with no symptoms of chest tightness. His presentation is somewhat atypical as he describes it as a mild tightening of the "skin" in the mediastinal region.  His EKG is not very different from previous EKG in 2003. The only subtle change is nonspecific T wave in aVF. Previous T-wave abnormality in lead 3 was previously noted. His chest discomfort presentation has come on only with his upper respiratory infection and prior to that he had no symptoms with exertion.  I suggested to him that he has several options including watchful waiting with continued mild exercise. As his URI improved, I hope that his symptoms would also improve. Second option would be to have him complete an echocardiogram and stress test. He will call me early next week to let me know if he is getting better. His symptoms persist or get worse, we will try to order a stress test.   His updated medication list for this problem includes:    Ziac 2.5-6.25 Mg Tabs (Bisoprolol-hydrochlorothiazide) .Marland Kitchen... Take 1 tablet by mouth once a day    Aspir-low 81 Mg Tbec (Aspirin) .Marland Kitchen... 1 by mouth once daily  Orders: EKG w/ Interpretation (93000)  Problem # 2:  HYPERCHOLESTEROLEMIA (ICD-272.0) Given his strong family history of coronary artery disease, father with MI at age 18, I have suggested that he came for a lower cholesterol. Notes indicate a allergy to Lipitor but he is unaware of any problems. No history of elevated liver function tests per the notes. We have suggested he could try Crestor starting 5 mg daily, titrating to 10 mg  daily. He also has a long smoking history which places him at high risk. I am encouraged by his continued exercise and have encouraged him to continue his. I've also encouraged an aspirin a day.  His updated medication list for this problem includes:    Pravachol 20 Mg Tabs (Pravastatin sodium) .Marland Kitchen... Take one by mouth daily  Problem # 3:  ABNORMAL EKG (ICD-794.31) As mentioned, T-wave abnormality in lead 3 was previously seen on EKG in 2003. Supple T wave abnormality in lead aVF is nonspecific.  His updated medication list for this problem includes:    Ziac 2.5-6.25 Mg Tabs (Bisoprolol-hydrochlorothiazide) .Marland Kitchen... Take 1 tablet by mouth once a day    Aspir-low 81 Mg Tbec (Aspirin) .Marland Kitchen... 1 by mouth once daily

## 2010-03-27 NOTE — Assessment & Plan Note (Signed)
Summary: F/U COLD/CLE   Vital Signs:  Patient profile:   55 year old male Height:      69.75 inches Weight:      233.50 pounds BMI:     33.87 Temp:     97.9 degrees F oral Pulse rate:   88 / minute Pulse rhythm:   regular BP sitting:   124 / 80  (left arm) Cuff size:   large  Vitals Entered By: Delilah Shan CMA  Dull) (February 28, 2010 12:04 PM) CC: F/U  cold symptoms   History of Present Illness: Seen in 12/11.  "90% better from before."  Done with antibiotics and cough med.  Still with coughing fits and some sputum.  Sleeping better and "I've got my strength back" but having trouble getting back into walking several miles.  He has some diffuse chest pain with initial activity and then it gets better.  No similar pain with cough.  Not short of breath.  Had negative cardiac work up a few years ago. He didn't have these symptoms before the illness at the end of 2011.  No CP currently or at rest.   Allergies: 1)  ! Lipitor 2)  ! Mentax 3)  ! * Chantix  Past History:  Past Surgical History: Last updated: 03/17/2008 Stress myoview negative 1/06 ABI's normal 2/07 colonoscopy polyp (non adenoma) (1/10)- re check 10 y  Family History: Last updated: 05/28/2006 strong fam hx of CAD father died of CAD, DM cousin-sudden death (?cardiac)  Social History: Last updated: 01/11/2008 quit smoking 1/07 Patient is a former smoker.  married  regular exercise at gym   Past Medical History: GERD Hypertension hyperlipidemia  hemorroids  colon polyp/ non adenoma  Review of Systems       See HPI.  Otherwise negative.    Physical Exam  General:  GEN: nad, alert and oriented HEENT: mucous membranes moist, TM w/o erythema, OP wnl NECK: supple w/o LA CV: rrr. PULM: ctab, no inc wob, no wheeze ABD: soft, +bs EXT: no edema    Impression & Recommendations:  Problem # 1:  CHEST PAIN (ICD-786.50) EKG reviewed and no sig change from prev.  He does have bradycardia on EKG but  has no orthostatic symptoms.  Given the lack of orthostasis and the family history of CAD (and personal h/o chest pain with intial exercise), I would not stop the betablocker now.  I am concerned for breakthrough angina and he'll start 81mg  ASA a day and follow up with cards.  If more chest pain, then he is to go to ER.  Limit exertion in meantime.  He agrees.  I think he has recovered from the prior illness as his lungs are clear to auscultation bilaterally.   Orders: EKG w/ Interpretation (93000) Cardiology Referral (Cardiology) T-2 View CXR (71020TC)  Complete Medication List: 1)  Ziac 2.5-6.25 Mg Tabs (Bisoprolol-hydrochlorothiazide) .... Take 1 tablet by mouth once a day 2)  Zantac 75 75 Mg Tabs (Ranitidine hcl) .... Take 1 tablet by mouth once a day 3)  Vitamin C 1000 Mg Tabs (Ascorbic acid) .... Take 1 tablet by mouth once a day 4)  Fish Oil Oil (Fish oil) .... Take 1 tablet by mouth once a day 5)  Pravachol 20 Mg Tabs (Pravastatin sodium) .... Take one by mouth daily 6)  Celebrex 200 Mg Caps (Celecoxib) 7)  Aspir-low 81 Mg Tbec (Aspirin) .Marland Kitchen.. 1 by mouth once daily  Patient Instructions: 1)  See Shirlee Limerick about your referral before your  leave today. 2)  Do not exert yourself until you see cardiology. 3)  If you get chest pain, go to the ER. 4)  Start taking 81mg  of aspirin a day.    Orders Added: 1)  Est. Patient Level IV [16109] 2)  EKG w/ Interpretation [93000] 3)  Cardiology Referral [Cardiology] 4)  T-2 View CXR [71020TC]    Current Allergies (reviewed today): ! LIPITOR ! MENTAX ! * CHANTIX

## 2010-04-02 NOTE — Progress Notes (Signed)
Summary: good with crestor  Phone Note Call from Patient Call back at [phone]   Caller: Patient Call For: Judith Part MD Summary of Call: Pt left message saying he is good with crestor, said you would know what he meant.  From reading his chart note it looks like he was to check with his insurance to see if this was covered.             Lowella Petties CMA, AAMA  March 25, 2010 2:35 PM   Follow-up for Phone Call        thanks - I want to start with 5 mg and see how that works - then advance to 10 if needed if he has any side eff stop med and let me know  please call in crestor 5 mg 1 by mouth once daily #30 3 ref sched fasting lab lipid/ast/alt 272 in 6 weeks  I will add to med list via last office note stop the pravachol Follow-up by: Judith Part MD,  March 25, 2010 4:17 PM  Additional Follow-up for Phone Call Additional follow up Details #1::        Patient notified as instructed by telephone. Medication phoned to [facility] as instructed. Fasting lab appointment scheduled as instructed 05/12/10 at 8:50am.Rena Isley LPN  March 26, 2010 9:40 AM

## 2010-04-02 NOTE — Assessment & Plan Note (Signed)
Summary: 6 MONTH FOLLOW UP/[facility]   Vital Signs:  Patient profile:   55 year old male Height:      69.75 inches Weight:      234.25 pounds BMI:     33.98 Temp:     98 degrees F oral Pulse rate:   60 / minute Pulse rhythm:   regular BP sitting:   146 / 86  (left arm) Cuff size:   regular  Vitals Entered By: Lewanda Rife LPN (March 24, 2010 9:28 AM) CC: six month f/u   History of Present Illness: here for f/u of HTN and lipids   wt is up 1 lb with bmi of 33 stable   HTN 146/86 first check   lipids are slt imp with trig 185 and HDL 43 and LDL 115 (from 124) Dr Mariah Milling wanted to put him on crestor   had to see Dr Para March -- was tx for lung infection  took a very long time to get better  had some chest pain -- felt like a skin pain --  had to see cardiology - and had abn EKg  saw Dr Mariah Milling for that   after holidays - got back into exercise and pain started again  walks 4 miles regularly -- with hills  is experiencing pain with peak exercise - then is ok with lower intensity exercise going to gym regularly - does really well with exercise   diet really fluctuates - with traveling -- is really good with breakfast and lunch  for dinner- is much harder - trying hard to do chicken and salads lots of holiday functions   is still coughing up a lot of phlegm -- more than he expected  this makes his chest sore  no skin rash        Allergies: 1)  ! Lipitor 2)  ! Mentax 3)  ! * Chantix  Past History:  Past Medical History: Last updated: 02/28/2010 GERD Hypertension hyperlipidemia  hemorroids  colon polyp/ non adenoma  Past Surgical History: Last updated: 03/17/2008 Stress myoview negative 1/06 ABI's normal 2/07 colonoscopy polyp (non adenoma) (1/10)- re check 10 y  Family History: Last updated: 05/28/2006 strong fam hx of CAD father died of CAD, DM cousin-sudden death (?cardiac)  Social History: Last updated: 01/11/2008 quit smoking 1/07 Patient is a  former smoker.  married  regular exercise at gym   Risk Factors: Smoking Status: quit (05/28/2006)  Review of Systems General:  Complains of fatigue; denies chills, fever, loss of appetite, and malaise. Eyes:  Denies blurring and eye irritation. ENT:  Complains of postnasal drainage; denies nasal congestion and sore throat. CV:  Complains of chest pain or discomfort; denies lightheadness, palpitations, shortness of breath with exertion, and swelling of feet. Resp:  Complains of cough; denies pleuritic, shortness of breath, sputum productive, and wheezing. GI:  Denies abdominal pain, bloody stools, indigestion, and loss of appetite. MS:  Denies muscle aches and cramps. Derm:  Denies itching, lesion(s), poor wound healing, and rash. Neuro:  Denies headaches, numbness, and tingling. Psych:  mood is ok . Endo:  Denies cold intolerance, excessive thirst, excessive urination, and heat intolerance. Heme:  Denies abnormal bruising and bleeding.  Physical Exam  General:  overweight but generally well appearing  Head:  normocephalic, atraumatic, and no abnormalities observed.   no sinus tenderness Eyes:  vision grossly intact, pupils equal, pupils round, pupils reactive to light, and no injection.   Nose:  no nasal discharge.   Mouth:  pharynx  pink and moist.   Neck:  supple with full rom and no masses or thyromegally, no JVD or carotid bruit  Chest Wall:  No deformities, masses, tenderness or gynecomastia noted. Lungs:  Normal respiratory effort, chest expands symmetrically. Lungs are clear to auscultation, no crackles or wheezes. Heart:  Normal rate and regular rhythm. S1 and S2 normal without gallop, murmur, click, rub or other extra sounds. Abdomen:  Bowel sounds positive,abdomen soft and non-tender without masses, organomegaly or hernias noted. no renal bruits  Msk:  No deformity or scoliosis noted of thoracic or lumbar spine.  no acute joint changes  Pulses:  R and L  carotid,radial,femoral,dorsalis pedis and posterior tibial pulses are full and equal bilaterally Extremities:  No clubbing, cyanosis, edema, or deformity noted with normal full range of motion of all joints.   Neurologic:  sensation intact to light touch, gait normal, and DTRs symmetrical and normal.   Skin:  Intact without suspicious lesions or rashes Cervical Nodes:  No lymphadenopathy noted Inguinal Nodes:  No significant adenopathy Psych:  normal affect, talkative and pleasant    Impression & Recommendations:  Problem # 1:  CHEST PAIN (ICD-786.50) Assessment Unchanged still having some mild pain at peak exercise  ? if rel to bronchitis or heart will sched stress test with cardiol as planned I think this is smart in light of former smoking/chol / fam hx   Problem # 2:  HYPERTENSION (ICD-401.9) Assessment: Unchanged  this is stable on ziac  no change refilled med  exercise and diet are better His updated medication list for this problem includes:    Ziac 2.5-6.25 Mg Tabs (Bisoprolol-hydrochlorothiazide) .Marland Kitchen... Take 1 tablet by mouth once a day  BP today: 146/86 Prior BP: 142/72 (03/04/2010)  Labs Reviewed: K+: 4.5 (08/19/2009) Creat: : 1.1 (08/19/2009)   Chol: 195 (03/18/2010)   HDL: 43.00 (03/18/2010)   LDL: 115 (03/18/2010)   TG: 185.0 (03/18/2010)  Orders: Prescription Created Electronically [phone])  Problem # 3:  HYPERCHOLESTEROLEMIA (ICD-272.0) Assessment: Improved  this is slt imp but goal LDL is below 100 talked about crestor he will call ins to see if affordible and let me know rev diet- good overall   The following medications were removed from the medication list:    Pravachol 20 Mg Tabs (Pravastatin sodium) .Marland Kitchen... Take one by mouth daily His updated medication list for this problem includes:    Crestor 5 Mg Tabs (Rosuvastatin calcium) .Marland Kitchen... 1 by mouth once daily  Labs Reviewed: SGOT: 25 (03/18/2010)   SGPT: 33 (03/18/2010)   HDL:43.00 (03/18/2010),  40.90 (08/19/2009)  LDL:115 (03/18/2010), 111 (11/26/2008)  Chol:195 (03/18/2010), 202 (08/19/2009)  Trig:185.0 (03/18/2010), 169.0 (08/19/2009)  Complete Medication List: 1)  Ziac 2.5-6.25 Mg Tabs (Bisoprolol-hydrochlorothiazide) .... Take 1 tablet by mouth once a day 2)  Zantac 75 75 Mg Tabs (Ranitidine hcl) .... Take 1 tablet by mouth once a day 3)  Vitamin C 1000 Mg Tabs (Ascorbic acid) .... Take 1 tablet by mouth once a day 4)  Fish Oil Oil (Fish oil) .... Take 1 tablet by mouth once a day 5)  Celebrex 200 Mg Caps (Celecoxib) .... Take 1 capsule by mouth once a day 6)  Aspir-low 81 Mg Tbec (Aspirin) .Marland Kitchen.. 1 by mouth once daily 7)  Crestor 5 Mg Tabs (Rosuvastatin calcium) .Marland Kitchen.. 1 by mouth once daily  Patient Instructions: 1)  follow up with Dr Mariah Milling for stress test- I think that is important  2)  keep up the good habits  3)  if  your cough does not improve over next 2 weeks please update me or if worse at any time  4)  call your insurance about coverage of crestor to let me know if it is affordible  5)  cholesterol is improved - but not quite at goal  6)  you can raise your HDL (good cholesterol) by increasing exercise and eating omega 3 fatty acid supplement like fish oil or flax seed oil over the counter 7)  you can lower LDL (bad cholesterol) by limiting saturated fats in diet like red meat, fried foods, egg yolks, fatty breakfast meats, high fat dairy products and shellfish  Prescriptions: CRESTOR 5 MG TABS (ROSUVASTATIN CALCIUM) 1 by mouth once daily  #30 x 3   Entered and Authorized by:   Judith Part MD   Signed by:   Judith Part MD on 03/26/2010   Method used:   Historical   RxID:   [id number] ZIAC 2.5-6.25 MG  TABS (BISOPROLOL-HYDROCHLOROTHIAZIDE) Take 1 tablet by mouth once a day  #30 x 11   Entered and Authorized by:   Judith Part MD   Signed by:   Judith Part MD on 03/24/2010   Method used:   [id number]       [address]       [place], [place]  [address]       Ph: [phone] or [phone]       Fax: [phone]   RxID:   [id number]    Orders Added: 1)  Prescription Created Electronically [G8553] 2)  Est. Patient Level IV [33295]    Current Allergies (reviewed today): ! LIPITOR ! MENTAX ! * CHANTIX

## 2010-04-02 NOTE — Progress Notes (Signed)
Summary: stress test   Phone Note Call from Patient Call back at [phone]   Caller: Patient Call For: nurse Summary of Call: pt states his symptoms are not any better and would like to have stress test done. Initial call taken by: Lysbeth Galas CMA,  March 24, 2010 10:33 AM  Follow-up for Phone Call        What type of stress test would you like me to order? Your last note stated if pt's symptoms not improved to order stress test? Follow-up by: Lanny Hurst RN,  March 24, 2010 2:33 PM  Additional Follow-up for Phone Call Additional follow up Details #1::        Would order a exercise myoview in [place] office. Thanks, Tim     Appended Document: stress test Spoke to pt, scheduled @ [facility] for Exercise Myoview 04/07/10 @ 1200. Pt aware. Gave instructions over the phone and to hold Bystolic day of procedure. Mailed instructions to pt.   Clinical Lists Changes  Orders: Added new Referral order of Nuclear Stress Test (Nuc Stress Test) - Signed

## 2010-04-03 ENCOUNTER — Telehealth (INDEPENDENT_AMBULATORY_CARE_PROVIDER_SITE_OTHER): Payer: Self-pay | Admitting: Radiology

## 2010-04-07 ENCOUNTER — Ambulatory Visit (HOSPITAL_BASED_OUTPATIENT_CLINIC_OR_DEPARTMENT_OTHER): Payer: BC Managed Care – PPO

## 2010-04-07 ENCOUNTER — Inpatient Hospital Stay (HOSPITAL_COMMUNITY)
Admission: AD | Admit: 2010-04-07 | Discharge: 2010-04-09 | DRG: 854 | Disposition: A | Discharge status: Home / Self Care | Payer: BC Managed Care – PPO | Source: Ambulatory Visit | Attending: Cardiology | Admitting: Cardiology

## 2010-04-07 ENCOUNTER — Other Ambulatory Visit: Payer: Self-pay | Admitting: Cardiology

## 2010-04-07 ENCOUNTER — Encounter: Payer: Self-pay | Admitting: Cardiology

## 2010-04-07 ENCOUNTER — Ambulatory Visit (INDEPENDENT_AMBULATORY_CARE_PROVIDER_SITE_OTHER): Payer: BC Managed Care – PPO | Admitting: Cardiology

## 2010-04-07 ENCOUNTER — Encounter: Payer: Self-pay | Admitting: Cardiovascular Disease

## 2010-04-07 ENCOUNTER — Encounter (INDEPENDENT_AMBULATORY_CARE_PROVIDER_SITE_OTHER): Payer: Self-pay | Admitting: *Deleted

## 2010-04-07 DIAGNOSIS — I200 Unstable angina: Secondary | ICD-10-CM | POA: Insufficient documentation

## 2010-04-07 DIAGNOSIS — Z87891 Personal history of nicotine dependence: Secondary | ICD-10-CM

## 2010-04-07 DIAGNOSIS — R0989 Other specified symptoms and signs involving the circulatory and respiratory systems: Secondary | ICD-10-CM

## 2010-04-07 DIAGNOSIS — I10 Essential (primary) hypertension: Secondary | ICD-10-CM | POA: Diagnosis present

## 2010-04-07 DIAGNOSIS — Z860100 Personal history of colon polyps, unspecified: Secondary | ICD-10-CM

## 2010-04-07 DIAGNOSIS — I4949 Other premature depolarization: Secondary | ICD-10-CM

## 2010-04-07 DIAGNOSIS — I2510 Atherosclerotic heart disease of native coronary artery without angina pectoris: Principal | ICD-10-CM | POA: Diagnosis present

## 2010-04-07 DIAGNOSIS — R079 Chest pain, unspecified: Secondary | ICD-10-CM

## 2010-04-07 DIAGNOSIS — R0609 Other forms of dyspnea: Secondary | ICD-10-CM

## 2010-04-07 DIAGNOSIS — R931 Abnormal findings on diagnostic imaging of heart and coronary circulation: Secondary | ICD-10-CM

## 2010-04-07 DIAGNOSIS — Z7982 Long term (current) use of aspirin: Secondary | ICD-10-CM

## 2010-04-07 DIAGNOSIS — E785 Hyperlipidemia, unspecified: Secondary | ICD-10-CM | POA: Diagnosis present

## 2010-04-07 DIAGNOSIS — R0789 Other chest pain: Secondary | ICD-10-CM

## 2010-04-07 DIAGNOSIS — K219 Gastro-esophageal reflux disease without esophagitis: Secondary | ICD-10-CM | POA: Diagnosis present

## 2010-04-07 HISTORY — DX: Abnormal findings on diagnostic imaging of heart and coronary circulation: R93.1

## 2010-04-07 LAB — CARDIAC PANEL(CRET KIN+CKTOT+MB+TROPI)
CK, MB: 1.9 ng/mL (ref 0.3–4.0)
Relative Index: INVALID (ref 0.0–2.5)
Total CK: 65 U/L (ref 7–232)

## 2010-04-07 LAB — COMPREHENSIVE METABOLIC PANEL
ALT: 29 U/L (ref 0–53)
AST: 26 U/L (ref 0–37)
Albumin: 4 g/dL (ref 3.5–5.2)
CO2: 25 mEq/L (ref 19–32)
Calcium: 9.4 mg/dL (ref 8.4–10.5)
GFR calc Af Amer: >60 mL/min (ref >60)
Sodium: 138 mEq/L (ref 135–145)
Total Protein: 6.8 g/dL (ref 6.0–8.3)

## 2010-04-07 LAB — CBC
MCH: 31.9 pg (ref 26.0–34.0)
MCV: 93.1 fL (ref 78.0–100.0)
Platelets: 236 10*3/uL (ref 150–400)
RBC: 4.61 MIL/uL (ref 4.22–5.81)

## 2010-04-08 DIAGNOSIS — I2510 Atherosclerotic heart disease of native coronary artery without angina pectoris: Secondary | ICD-10-CM

## 2010-04-08 DIAGNOSIS — I498 Other specified cardiac arrhythmias: Secondary | ICD-10-CM

## 2010-04-08 LAB — CBC
HCT: 42.4 % (ref 39.0–52.0)
Hemoglobin: 14.1 g/dL (ref 13.0–17.0)
MCH: 30.9 pg (ref 26.0–34.0)
MCHC: 33.3 g/dL (ref 30.0–36.0)
MCV: 93 fL (ref 78.0–100.0)

## 2010-04-08 LAB — CARDIAC PANEL(CRET KIN+CKTOT+MB+TROPI)
CK, MB: 1.5 ng/mL (ref 0.3–4.0)
Total CK: 60 U/L (ref 7–232)

## 2010-04-08 LAB — LIPID PANEL
Cholesterol: 165 mg/dL (ref 0–200)
HDL: 42 mg/dL (ref >39)
Triglycerides: 179 mg/dL — ABNORMAL HIGH (ref <150)

## 2010-04-09 ENCOUNTER — Telehealth: Payer: Self-pay | Admitting: Cardiology

## 2010-04-09 DIAGNOSIS — I200 Unstable angina: Secondary | ICD-10-CM

## 2010-04-09 HISTORY — PX: OTHER SURGICAL HISTORY: SHX169

## 2010-04-09 HISTORY — PX: PTCA: SHX146

## 2010-04-09 LAB — BASIC METABOLIC PANEL
BUN: 12 mg/dL (ref 6–23)
CO2: 28 mEq/L (ref 19–32)
Calcium: 9.3 mg/dL (ref 8.4–10.5)
GFR calc non Af Amer: >60 mL/min (ref >60)
Glucose, Bld: 97 mg/dL (ref 70–99)
Potassium: 4.1 mEq/L (ref 3.5–5.1)

## 2010-04-09 LAB — CBC
HCT: 42.9 % (ref 39.0–52.0)
Hemoglobin: 14.4 g/dL (ref 13.0–17.0)
MCHC: 33.6 g/dL (ref 30.0–36.0)
MCV: 94.1 fL (ref 78.0–100.0)
RDW: 12.8 % (ref 11.5–15.5)

## 2010-04-10 NOTE — Procedures (Signed)
NAMENORM, WRAY         ACCOUNT NO.:  [id number]  MEDICAL RECORD NO.:  [id number]           PATIENT TYPE:  I  LOCATION:  6529                         FACILITY:  [facility]  PHYSICIAN:  Verne Carrow, MDDATE OF BIRTH:  12/22/1955  DATE OF PROCEDURE:  04/08/2010 DATE OF DISCHARGE:                           CARDIAC CATHETERIZATION   PRIMARY CARDIOLOGIST:  Antonieta Iba, MD  PRIMARY CARE PHYSICIAN:  Marne A. Tower, MD  REFERRING PHYSICIAN:  Madolyn Frieze. Jens Som, MD, [facility]  PROCEDURES PERFORMED: 1. Left heart catheterization. 2. Selective coronary angiography. 3. Left ventricular angiogram. 4. Percutaneous transluminal coronary angioplasty with placement two     overlapping drug-eluting stents in the mid right coronary artery.  ARTERIAL ACCESS SITE:  Right radial artery.  OPERATOR:  Verne Carrow, MD  INDICATIONS:  This is a 55 year old Caucasian male with a history of hypertension, hyperlipidemia, and tobacco abuse who has had recent complaints of exertional chest pain.  The patient underwent a stress Myoview test that showed inferior wall scar with peri-infarct ischemia. The patient was admitted to the hospital yesterday.  Cardiac enzymes were negative.  The patient was admitted because he had chest pain while on the treadmill during the stress test.  DETAILS OF PROCEDURE:  The patient was brought to the Main Cardiac Catheterization Laboratory after signing informed consent for the procedure.  An Freida Busman test was performed on the right wrist and was positive.  The right wrist was prepped and draped in a sterile fashion. Lidocaine 1% was used for local anesthesia.  A 5-French sheath was inserted into the right radial artery without difficulty.  We had considerable difficulty engaging the native right coronary artery. Initially, we used a JR-4 catheter and a Williams Right catheter. Ultimately, we were able to get an Amplatz Right II catheter near  the ostium to perform injections.  A JL-3.5 catheter was used to selectively engage and inject the left coronary system.  A pigtail catheter was used to perform the left ventricular angiogram.  The patient tolerated the diagnostic procedure well.  At this point, we elected to proceed the intervention of the severe stenosis in the mid right coronary artery. The sheath was upsized to a 6-French sheath.  The patient was given 60 mg of Effient by mouth.  He was given a bolus of Angiomax and the drip was started.  A 6-French Amplatz Right II guiding catheter was used to selectively engage the native right coronary artery.  A Cougar intracoronary wire was passed through the guiding catheter into the native right coronary artery.  A 2.5 x 12-mm balloon was inflated three times in the proximal lesion of the mid section and once in the distal portion of the mid section.  The ACT was greater than 250 before balloon inflation.  A 3.5 x 28-mm Promus Element drug-eluting stent was carefully positioned and the distal portion of the mid vessel was inflated without difficulty.  A 3.5 x 20-mm Promus Element drug-eluting stent was carefully positioned in the proximal portion of the midsegment in an overlapping fashion with the first stent.  This was deployed without difficulty.  A 3.75 x 20-mm noncompliant balloon was inflated three  times inside the stented segment.  The stenosis was taken from 99% down to 0%.  There was excellent flow into the distal vessel at the conclusion of the case.  The patient tolerated the procedure well without any complications.  Sheath was removed and a Terumo hemostasis band was applied over the arteriotomy site.  The patient was taken to the holding area in stable condition.  The stenosis was taken from 99% down to 0%.  HEMODYNAMIC FINDINGS:  Central aortic pressure 143/83.  Left ventricular pressure 138/11.  Left ventricular end-diastolic pressure 16.  ANGIOGRAPHIC  FINDINGS: 1. The left main coronary artery had no obstructive disease. 2. Left anterior descending was a moderate-sized vessel that coursed     to the apex and gave off a bifurcating diagonal branch that was     moderate size.  The proximal LAD had mild plaque disease.  The     midvessel had 30% stenosis.  The diagonal branch had mild plaque     disease. 3. The circumflex artery gave off a moderate-sized obtuse marginal     branch.  There was mild plaque disease in this vessel. 4. The right coronary artery was a large dominant vessel with 99% mid     stenosis followed by an ulcerated 80% mid stenosis.  The distal     vessel had diffuse 30% stenosis.  The posterolateral branch     appeared to have 90% ostial stenosis and was a small-to-moderate-     sized vessel.  The posterior descending artery had plaque disease.     The posterolateral branch filled from native flow and also from     left-to-right collaterals. 5. Left ventricular angiogram was performed in the RAO projection,     which showed normal left ventricular systolic function with     ejection fraction of 55%.  IMPRESSION: 1. Single-vessel coronary artery disease. 2. Successful percutaneous transluminal coronary angioplasty with     placement of two overlapping drug-eluting stents in the mid right     coronary artery. 3. Normal left ventricular systolic function.  RECOMMENDATIONS:  The patient should be continued on Effient 10 mg once daily, aspirin 81 mg once daily, Crestor 10 mg once daily, and a beta- blocker if he tolerate this secondary to bradycardia.  We will watch closely tonight.  Discharge him home tomorrow if he is stable.     Verne Carrow, MD     CM/MEDQ  D:  04/08/2010  T:  04/09/2010  Job:  [id number]  cc:   Antonieta Iba, MD Audrie Gallus Milinda Antis, MD Madolyn Frieze. Jens Som, MD, [facility]  Electronically Signed by Verne Carrow MD on 04/10/2010 11:07:48 AM

## 2010-04-10 NOTE — Progress Notes (Signed)
Summary: nuc pre-procedure  Phone Note Outgoing Call   Call placed by: Domenic Polite, CNMT,  April 03, 2010 11:59 AM Call placed to: Patient Reason for Call: Confirm/change Appt Summary of Call: Left message with information on Myoview Information Sheet (see scanned document for details).      Nuclear Med Background Indications for Stress Test: Evaluation for Ischemia   History: Myocardial Perfusion Study  History Comments: '06 [facility] -Nl EF=62%  Symptoms: Chest Pain, Chest Pain with Exertion    Nuclear Pre-Procedure Cardiac Risk Factors: Family History - CAD, History of Smoking, Hypertension, Lipids, Obesity Height (in): 69.75

## 2010-04-15 ENCOUNTER — Telehealth: Payer: Self-pay | Admitting: Cardiology

## 2010-04-16 ENCOUNTER — Encounter: Payer: Self-pay | Admitting: Cardiology

## 2010-04-16 NOTE — Progress Notes (Signed)
Summary: medication question   Phone Note Call from Patient Call back at Home Phone [phone]   Caller: Spouse Reason for Call: Talk to Nurse, Talk to Doctor Summary of Call: pt was taking celebrex 200mg  and when he left the hospital it was not on med list and they need to know if he should be still it Initial call taken by: Omer Jack,  April 09, 2010 12:04 PM  Follow-up for Phone Call        spoke with pt wife, per dr Jens Som we do not want the pt to take celebrex Deliah Goody, RN  April 09, 2010 3:42 PM\par

## 2010-04-16 NOTE — Assessment & Plan Note (Signed)
Summary: CRS/CHEST PAINS/ABNORMAL EKG/WT:234/INS:BCBS/MD: GOLLAN-MB/BC...  Nuclear Med Background Indications for Stress Test: Evaluation for Ischemia   History: Myocardial Perfusion Study  History Comments: '06 [facility] -Nl EF=62%  Symptoms: Chest Pain, Chest Pain with Exertion, Chest Pressure, Chest Pressure with Exertion, DOE, Fatigue, Fatigue with Exertion, SOB    Nuclear Pre-Procedure Cardiac Risk Factors: Family History - CAD, History of Smoking, Hypertension, Lipids, Obesity Caffeine/Decaff Intake: None NPO After: 7:30 AM Lungs: clear IV 0.9% NS with Angio Cath: 18g     IV Site: R Antecubital IV Started by: Stanton Kidney, EMT-P Chest Size (in) 48     Height (in): 71 Weight (lb): 229 BMI: 32.05 Tech Comments: Held bystolic > 24 hours, per patient.  Nuclear Med Study 1 or 2 day study:  1 day     Stress Test Type:  Treadmill/Lexiscan Reading MD:  Cassell Clement, MD     Referring MD:  T.Gollan Resting Radionuclide:  Technetium 30m Tetrofosmin     Resting Radionuclide Dose:  10.5 mCi  Stress Radionuclide:  Technetium 63m Tetrofosmin     Stress Radionuclide Dose:  33.0 mCi   Stress Protocol Exercise Time (min):  10:04 min     Max HR:  117 bpm     Predicted Max HR:  168 bpm  Max Systolic BP: 188 mm Hg     Percent Max HR:  69.64 %     METS: 10.4 Rate Pressure Product:  04540    Stress Test Technologist:  Milana Na, EMT-P     Nuclear Technologist:  Doyne Keel, CNMT  Rest Procedure  Myocardial perfusion imaging was performed at rest 45 minutes following the intravenous administration of Technetium 14m Tetrofosmin.  Stress Procedure  The patient received IV Lexiscan 0.4 mg over 15-seconds with concurrent low level exercise and then Technetium 37m Tetrofosmin was injected at 30-seconds while the patient continued walking one more minute.  There were + significant changes with Lexiscan.  Quantitative spect images were obtained after a 45 minute delay.  QPS Raw Data Images:   Normal; no motion artifact; normal heart/lung ratio. Stress Images:  There is large area of  decreased uptake in the inferior wall Rest Images:  There is small area of decreased uptake in the inferior wall. Subtraction (SDS):  Reversible inferior wall ischemia Transient Ischemic Dilatation:  1.07  (Normal <1.22)  Lung/Heart Ratio:  0.35  (Normal <0.45)  Quantitative Gated Spect Images QGS EDV:  139 ml QGS ESV:  73 ml QGS EF:  48 % QGS cine images:  Mild inferior wall hypokinesis  Findings High risk nuclear study Clinically Abnormal (chest pain, ST abnormality, hypotension) Evidence for inferior ischemia   Evidence for LV Dysfunction LV Dysfunction    Overall Impression  Exercise Capacity: Began stress, switched to lexiscan due to chest pain. dyspnea and fatigue BP Response: Normal blood pressure response. Clinical Symptoms: Significant chest pain. ECG Impression: Significant ST abnormalities consistent with ischemia, worse during recovery. Overall Impression: High risk stress nuclear study. Overall Impression Comments: Study suggests small inferior wall scar with large area of inferior wall reversible ischemia.

## 2010-04-16 NOTE — Assessment & Plan Note (Signed)
Summary: POSTIIVE MYOVIEW/DM  Medications Added BYSTOLIC 5 MG TABS (NEBIVOLOL HCL) 1 tab by mouth once daily        Primary Provider:  Roxy Manns, MD   History of Present Illness: 55 year old male seen by Dr. Mariah Milling in January of 2012 with chest pain. Myoview performed today revealed chest pain with exertion. There is also evidence of a prior inferior infarct and mild to moderate peri-infarct ischemia. Ejection fraction was 48%. I was therefore asked to evaluate. Since Christmas the patient has noticed a pressure followed by a burning sensation in his chest. This initially occurred with walking up hills but improved after level and off. There is no associated nausea, shortness of breath or diaphoresis. The pain has slowly worsened. He has not had episodes at rest. However he did have pain while on the treadmill in the office today. He denies dyspnea on exertion, orthopnea, PND or syncope.  Current Medications (verified): 1)  Ziac 2.5-6.25 Mg  Tabs (Bisoprolol-Hydrochlorothiazide) .... Take 1 Tablet By Mouth Once A Day 2)  Zantac 75 75 Mg  Tabs (Ranitidine Hcl) .... Take 1 Tablet By Mouth Once A Day 3)  Vitamin C 1000 Mg Tabs (Ascorbic Acid) .... Take 1 Tablet By Mouth Once A Day 4)  Fish Oil   Oil (Fish Oil) .... Take 1 Tablet By Mouth Once A Day 5)  Celebrex 200 Mg Caps (Celecoxib) .... Take 1 Capsule By Mouth Once A Day 6)  Aspir-Low 81 Mg Tbec (Aspirin) .Marland Kitchen.. 1 By Mouth Once Daily 7)  Crestor 5 Mg Tabs (Rosuvastatin Calcium) .Marland Kitchen.. 1 By Mouth Once Daily 8)  Bystolic 5 Mg Tabs (Nebivolol Hcl) .Marland Kitchen.. 1 Tab By Mouth Once Daily  Allergies: 1)  ! Lipitor 2)  ! Mentax 3)  ! * Chantix  Past History:  Past Medical History: Reviewed history from 02/28/2010 and no changes required. GERD Hypertension hyperlipidemia  hemorroids  colon polyp/ non adenoma  Past Surgical History: Tonsillectomy colonoscopy polyp (non adenoma) (1/10)- re check 10 y  Family History: Reviewed history from  05/28/2006 and no changes required. strong fam hx of CAD father died of CAD, DM cousin-sudden death (?cardiac)  Social History: Reviewed history from 01/11/2008 and no changes required. quit smoking 1/07 Patient is a former smoker.  married  regular exercise at gym   Review of Systems       no fevers or chills, productive cough, hemoptysis, dysphasia, odynophagia, melena, hematochezia, dysuria, hematuria, rash, seizure activity, orthopnea, PND, pedal edema, claudication. Remaining systems are negative.   Vital Signs:  Patient profile:   55 year old male Height:      69 inches Weight:      234 pounds BMI:     34.68 Pulse rate:   66 / minute Resp:     16 per minute BP sitting:   156 / 90  (left arm)  Vitals Entered By: Kem Parkinson (April 07, 2010 3:42 PM)  Physical Exam  General:  Well-developed well-nourished in no acute distress.  Skin is warm and dry.  HEENT is normal.  Neck is supple. No thyromegaly.  Chest is clear to auscultation with normal expansion.  Cardiovascular exam is regular rate and rhythm.  Abdominal exam nontender or distended. No masses palpated. Extremities show no edema. neuro grossly intact    Impression & Recommendations:  Problem # 1:  UNSTABLE ANGINA (ICD-411.1) Patient has chest pain that is classic for angina. It is slowly progressing. His myoview is also abnormal. I will admit to telemetry.  Treat with aspirin, beta blocker, heparin and statin. Cycle enzymes. Proceed with cardiac catheterization tomorrow morning. The risks and benefits including but not limited to death, myocardial infarction and CVA were discussed and the patient agrees to proceed. His updated medication list for this problem includes:    Ziac 2.5-6.25 Mg Tabs (Bisoprolol-hydrochlorothiazide) .Marland Kitchen... Take 1 tablet by mouth once a day    Aspir-low 81 Mg Tbec (Aspirin) .Marland Kitchen... 1 by mouth once daily    Bystolic 5 Mg Tabs (Nebivolol hcl) .Marland Kitchen... 1 tab by mouth once  daily  Problem # 2:  HYPERCHOLESTEROLEMIA (ICD-272.0) Continue statin. His updated medication list for this problem includes:    Crestor 5 Mg Tabs (Rosuvastatin calcium) .Marland Kitchen... 1 by mouth once daily  Problem # 3:  HYPERTENSION (ICD-401.9) Follow blood pressure and adjust regimen as indicated. Discontinue Ziac and bystolic and treat with lopressor. His updated medication list for this problem includes:    Ziac 2.5-6.25 Mg Tabs (Bisoprolol-hydrochlorothiazide) .Marland Kitchen... Take 1 tablet by mouth once a day    Aspir-low 81 Mg Tbec (Aspirin) .Marland Kitchen... 1 by mouth once daily    Bystolic 5 Mg Tabs (Nebivolol hcl) .Marland Kitchen... 1 tab by mouth once daily

## 2010-04-18 NOTE — Discharge Summary (Signed)
NAMEMISSAEL, FERRARI         ACCOUNT NO.:  [id number]  MEDICAL RECORD NO.:  [id number]           PATIENT TYPE:  I  LOCATION:  6529                         FACILITY:  [facility]  PHYSICIAN:  Madolyn Frieze. Jens Som, MD, FACCDATE OF BIRTH:  [birth year]  DATE OF ADMISSION:  04/07/2010 DATE OF DISCHARGE:  04/09/2010                              DISCHARGE SUMMARY   PRIMARY CARDIOLOGIST:  Madolyn Frieze. Jens Som, MD, [facility]  PRIMARY CARE PHYSICIAN:  Marne A. Tower, MD  DISCHARGE DIAGNOSIS:  Unstable angina.  SECONDARY DIAGNOSES: 1. Coronary artery disease, status post successful percutaneous     coronary intervention and drug-eluting stent placement x2 to the     right coronary artery this admission. 2. Hypertension. 3. Hyperlipidemia. 4. Borderline bradycardia. 5. Remote tobacco abuse. 6. Gastroesophageal reflux disease. 7. History of hemorrhoids. 8. History of colon polyps, status post polypectomy in January 2010    (non adenomatous). 9. Status post tonsillectomy.  ALLERGIES:  LIPITOR, MENTAX, and CHANTIX.  PROCEDURES:  Left heart cardiac catheterization revealing nonobstructive disease in the LAD and circumflex with 99% stenosis in the mid-right coronary artery along with an 80% ulcerated plaque in the mid-right coronary artery.  The posterolateral branch had 80% ostial stenosis and was a small to moderate-sized vessel.  EF of 55%.  The mid-right coronary artery was successfully stented with a 3.5 x 20-mm Promus element Plus drug-eluting stent as well as a 2.5 x 28-mm Promus element Plus drug-eluting stent.  HISTORY OF PRESENT ILLNESS:  A 55 year old male who was seen in January 2012 in our [place] office secondary to complaints of chest pain and was subsequently set up for a Myoview, which was performed on April 07, 2010 revealing prior inferior infarct and mild to moderate peri- infarct ischemia with an EF of 48%.  The patient also had chest pain with exercise.  The patient was  seen by Dr. Jens Som on the office that day and decision was made to admit him for further evaluation and catheterization.  HOSPITAL COURSE:  The patient ruled out for MI.  He underwent diagnostic catheterization on April 07, 2010 revealing nonobstructive disease throughout the left coronary tree with significant stenoses in the mid- right coronary artery.  EF was 55%.  Attention was turned to the right coronary artery, which was subsequently stented with two Promus element plus drug-eluting stents as outlined above.  The patient tolerated this procedure well and postprocedure has been ambulating without recurrent symptoms or limitations.  We have added low-dose aspirin as well as Effient.  The patient prefers to have future followup in the [facility] office and therefore we have arranged for this in approximately 2 weeks. He will be discharged home today in good condition.  DISCHARGE LABS:  Hemoglobin 14.4, hematocrit 42.9, WBC 8.8, platelets 197.  Sodium 138, potassium 4.1, chloride 104, CO2 of 28, BUN 12, creatinine 1.13, glucose 97.  Total bilirubin 1.0, alkaline phosphatase 41, AST 26, ALT 29, total protein 6.8, albumin 4.0, calcium 9.3.  CK 60, MB 1.5, troponin I 0.01.  Total cholesterol 165, triglycerides 179, HDL 42, LDL 87.  TSH 1.110.  DISPOSITION:  The patient will be discharged home today in good  condition.  FOLLOWUP PLANS AND APPOINTMENTS:  The patient will follow up with Tereso Newcomer, PA-C in our [facility] office on April 21, 2010 at 10 a.m. Follow up with Dr. Milinda Antis as scheduled.  DISCHARGE MEDICATIONS: 1. Lisinopril 10 mg daily. 2. Toprol-XL 25 mg daily. 3. Nitroglycerin 0.4 mg sublingual p.r.n. chest pain. 4. Protonix 40 mg daily. 5. Prasugrel 10 mg daily. 6. Crestor 10 mg daily. 7. Aspirin 81 mg daily. 8. Fish oil over-the-counter 1 capsule daily. 9. Vitamin C over-the-counter 1 capsule daily.  OUTSTANDING LABS AND STUDIES:  None.  DURATION OF  DISCHARGE ENCOUNTER:  Sixty minutes including physician time.     Nicolasa Ducking, ANP   ______________________________ Madolyn Frieze. Jens Som, MD, [facility]    CB/MEDQ  D:  04/09/2010  T:  04/09/2010  Job:  [id number]  cc:   Marne A. Milinda Antis, MD  Electronically Signed by Nicolasa Ducking ANP on 04/16/2010 02:05:48 PM Electronically Signed by Olga Millers MD [facility] on 04/18/2010 06:52:21 PM

## 2010-04-21 ENCOUNTER — Encounter: Payer: BC Managed Care – PPO | Admitting: Physician Assistant

## 2010-04-22 NOTE — Progress Notes (Signed)
Summary: talk to nurse   Phone Note Call from Patient Call back at Home Phone [phone]   Caller: Patient Summary of Call: pt has a knott on his wrist right below the incision site that came up on Saturday. Tender, arm is bruised, no redness or heat. [phone] Initial call taken by: Edman Circle,  April 15, 2010 8:42 AM  Follow-up for Phone Call        spoke with pt, sat evening he developed a knott about the size of a marble at the site of his cath on his right wrist. he states it is alittle tender in the area. the area is not red or angry looking. his hand is warm. reassurance given to the pt, he will call back if any changes noted Deliah Goody, RN  April 15, 2010 9:21 AM

## 2010-04-24 ENCOUNTER — Encounter: Payer: Self-pay | Admitting: Physician Assistant

## 2010-04-24 ENCOUNTER — Other Ambulatory Visit: Payer: Self-pay | Admitting: Cardiology

## 2010-04-24 ENCOUNTER — Encounter (INDEPENDENT_AMBULATORY_CARE_PROVIDER_SITE_OTHER): Payer: BC Managed Care – PPO | Admitting: Physician Assistant

## 2010-04-24 ENCOUNTER — Other Ambulatory Visit: Payer: BC Managed Care – PPO

## 2010-04-24 DIAGNOSIS — E7800 Pure hypercholesterolemia, unspecified: Secondary | ICD-10-CM

## 2010-04-24 DIAGNOSIS — I10 Essential (primary) hypertension: Secondary | ICD-10-CM

## 2010-04-24 DIAGNOSIS — I2510 Atherosclerotic heart disease of native coronary artery without angina pectoris: Secondary | ICD-10-CM | POA: Insufficient documentation

## 2010-04-24 DIAGNOSIS — E785 Hyperlipidemia, unspecified: Secondary | ICD-10-CM

## 2010-04-24 LAB — BASIC METABOLIC PANEL
BUN: 18 mg/dL (ref 6–23)
CO2: 31 mEq/L (ref 19–32)
GFR: 82.39 mL/min (ref >60.00)
Glucose, Bld: 85 mg/dL (ref 70–99)
Potassium: 4.4 mEq/L (ref 3.5–5.1)
Sodium: 137 mEq/L (ref 135–145)

## 2010-04-24 NOTE — Assessment & Plan Note (Signed)
He is doing well post PCI.  He denies angina.  He is taking aspirin.  I explained to him the importance of continuing to take aspirin and Effient.  Followup with Dr. Jens Som in the next 6 weeks.  He he will contact us if he decides to pursue cardiac rehabilitation.

## 2010-04-24 NOTE — Assessment & Plan Note (Signed)
Controlled.  Check a basic metabolic panel today to followup on renal function and potassium given the recent initiation of ACE inhibitor.

## 2010-04-24 NOTE — Progress Notes (Signed)
History of Present Illness: Primary Cardiologist:  Dr. Olga Millers  The patient is a 55 year old male who was admitted from the office on 04/07/10 with unstable angina.  He had Myoview study that day that was abnormal with inferior scar and mild to moderate peri-Infarct ischemia.  EF was 48%.  Cardiac catheterization demonstrated 30% LAD stenosis and mild plaque in the circumflex.  He had a mid 99% and then an ulcerated 80% stenosis in the RCA.  This was treated with 2 drug-eluting stents (Promus).  He also had a 90% ostial posterior lateral branch stenosis.  There are left to right collaterals.  EF 55%.  His Crestor was increased from 5-10 mg a day.  Effient was added to his medical regimen.  Lisinopril was also added.  He returns for followup.  He is doing well.  He denies chest pain, shortness of breath, syncope.  He denies orthopnea, PND or pedal edema.  His right radial site is somewhat tender.  He is back to lifting weights.  He is walking several times a week.  He denies any anginal symptoms.  He travels several times a week.  He is interested in cardiac rehabilitation.  However, he currently is not sure if he will be able to participate.  Past Medical History  Diagnosis Date  . CAD (coronary artery disease)     s/p Promus DES x 2 RCA 04/08/10;  Cath 2/12: mLAD 30%; CFX mild plaque; mRCA 99% then ulc. 80% tx'd with PCI, dRCA 30%; oPLB 90% (L-R collat); EF 55%  . Abnormal nuclear cardiac imaging test 04/07/10    EF 48%; inferior scar; mild-moderate peri-infarct ischemia  . Hypertension   . Hyperlipidemia   . Bradycardia   . GERD (gastroesophageal reflux disease)   . Colon polyps   . Hemorrhoids     Current Medications: Current outpatient prescriptions:Ascorbic Acid (VITAMIN C) 1000 MG tablet, Take 1,000 mg by mouth daily.  , Disp: , Rfl: ;  fish oil-omega-3 fatty acids 1000 MG capsule, Take 1 g by mouth daily.  , Disp: , Rfl: ;  lisinopril (PRINIVIL,ZESTRIL) 10 MG tablet, Take 10 mg by  mouth daily.  , Disp: , Rfl: ;  metoprolol succinate (TOPROL-XL) 25 MG 24 hr tablet, Take 25 mg by mouth daily.  , Disp: , Rfl:  pantoprazole (PROTONIX) 40 MG tablet, Take 40 mg by mouth daily.  , Disp: , Rfl: ;  prasugrel (EFFIENT) 10 MG TABS, Take 10 mg by mouth daily.  , Disp: , Rfl: ;  rosuvastatin (CRESTOR) 10 MG tablet, Take 10 mg by mouth daily.  , Disp: , Rfl:   Allergies  Allergen Reactions  . Atorvastatin     REACTION: elevated liver functions  . Butenafine     REACTION: burned  . Varenicline Tartrate     REACTION: sleepy    Vital Signs: BP 138/78  Pulse 64  Ht 5\' 9"  (1.753 m)  Wt 236 lb (107.049 kg)  BMI 34.85 kg/m2  PHYSICAL EXAM: Well nourished, well developed, in no acute distress HEENT: normal Neck: no JVD Cardiac:  normal S1, S2; RRR; no murmur Lungs:  clear to auscultation bilaterally, no wheezing, rhonchi or rales Abd: soft, nontender, no hepatomegaly Ext: no edema; Right radial site without hematoma or bruit Skin: warm and dry Neuro:  CNs 2-12 intact, no focal abnormalities noted  EKG: Normal sinus rhythm; heart rate 64; normal axis; no acute changes

## 2010-04-24 NOTE — Assessment & Plan Note (Signed)
Followed by primary care.   

## 2010-05-01 NOTE — Assessment & Plan Note (Signed)
Summary: Please see noted done in [facility] that is scanned in.   Medications Added METOPROLOL SUCCINATE 25 MG XR24H-TAB (METOPROLOL SUCCINATE) Take one tablet by mouth daily PANTOPRAZOLE SODIUM 40 MG TBEC (PANTOPRAZOLE SODIUM) Take 1 tablet by mouth once a day CRESTOR 10 MG TABS (ROSUVASTATIN CALCIUM) Take one tablet by mouth daily. LISINOPRIL 10 MG TABS (LISINOPRIL) Take one tablet by mouth daily EFFIENT 10 MG TABS (PRASUGREL HCL) Take one tablet by mouth daily      Allergies Added:   Visit Type:  Follow-up Primary Provider:  Roxy Manns, MD  CC:  no complaints.  History of Present Illness: Please see noted done in [facility] that is scanned in.   Current Medications (verified): 1)  Metoprolol Succinate 25 Mg Xr24h-Tab (Metoprolol Succinate) .... Take One Tablet By Mouth Daily 2)  Pantoprazole Sodium 40 Mg Tbec (Pantoprazole Sodium) .... Take 1 Tablet By Mouth Once A Day 3)  Vitamin C 1000 Mg Tabs (Ascorbic Acid) .... Take 1 Tablet By Mouth Once A Day 4)  Fish Oil   Oil (Fish Oil) .... Take 1 Tablet By Mouth Once A Day 5)  Crestor 10 Mg Tabs (Rosuvastatin Calcium) .... Take One Tablet By Mouth Daily. 6)  Lisinopril 10 Mg Tabs (Lisinopril) .... Take One Tablet By Mouth Daily 7)  Effient 10 Mg Tabs (Prasugrel Hcl) .... Take One Tablet By Mouth Daily  Allergies (verified): 1)  ! Lipitor 2)  ! Mentax 3)  ! * Chantix  Vital Signs:  Patient profile:   55 year old male Height:      69 inches Weight:      236 pounds BMI:     34.98 Pulse rate:   64 / minute BP sitting:   138 / 78  (left arm) Cuff size:   regular  Vitals Entered By: Hardin Negus, RMA (April 24, 2010 9:44 AM)   Other Orders: EKG w/ Interpretation (93000) TLB-BMP (Basic Metabolic Panel-BMET) (80048-METABOL)  Patient Instructions: 1)  Your physician recommends that you schedule a follow-up appointment in: 06/05/10 @ 9AM with Dr. Jens Som as per Tereso Newcomer, PA-C.... 2)  Your physician  recommends that you return for lab work in: Today BMET 414.01..... 3)  Your physician recommends that you continue on your current medications as directed. Please refer to the Current Medication list given to you today.

## 2010-05-06 NOTE — Letter (Signed)
Summary: [facility] Progress Note  [facility] Progress Note   Imported By: Earl Many 05/02/2010 12:51:30  _____________________________________________________________________  External Attachment:    Type:   Image     Comment:   External Document

## 2010-05-06 NOTE — Miscellaneous (Signed)
Summary: [place] Physician Order/Treatment Plan   [facility] Physician Order/Treatment Plan   Imported By: Roderic Ovens 05/02/2010 10:37:09  _____________________________________________________________________  External Attachment:    Type:   Image     Comment:   External Document

## 2010-05-12 ENCOUNTER — Other Ambulatory Visit: Payer: Self-pay

## 2010-05-19 ENCOUNTER — Other Ambulatory Visit: Payer: Self-pay | Admitting: Family Medicine

## 2010-05-19 DIAGNOSIS — E7800 Pure hypercholesterolemia, unspecified: Secondary | ICD-10-CM

## 2010-05-20 ENCOUNTER — Other Ambulatory Visit (INDEPENDENT_AMBULATORY_CARE_PROVIDER_SITE_OTHER): Payer: BC Managed Care – PPO | Admitting: Family Medicine

## 2010-05-20 DIAGNOSIS — E7800 Pure hypercholesterolemia, unspecified: Secondary | ICD-10-CM

## 2010-05-20 LAB — LIPID PANEL
HDL: 48.2 mg/dL (ref >39.00)
LDL Cholesterol: 118 mg/dL — ABNORMAL HIGH (ref 0–99)
Total CHOL/HDL Ratio: 4
VLDL: 31.2 mg/dL (ref 0.0–40.0)

## 2010-05-20 LAB — ALT: ALT: 33 U/L (ref 0–53)

## 2010-05-20 LAB — AST: AST: 26 U/L (ref 0–37)

## 2010-05-21 ENCOUNTER — Telehealth: Payer: Self-pay

## 2010-05-21 NOTE — Telephone Encounter (Signed)
Patient notified as instructed by telephone. Pt stated Dr Jens Som stopped Crestor when had stents put in 04/08/10. Pt said all his meds have changed and he does not think he is on med for cholesterol.Pt wonders if Dr Milinda Antis wants him to make appt with her to come in and go over meds.Please advise.

## 2010-05-21 NOTE — Telephone Encounter (Signed)
Message copied by Lewanda Rife on Wed May 21, 2010  6:59 PM ------      Message from: Roxy Manns      Created: Wed May 21, 2010  8:38 AM       According to chart crestor is at 10 mg daily - please verify he is taking that and tolerating it      His LDL went up from 87 to 118-- is he eating differently?

## 2010-05-22 ENCOUNTER — Telehealth: Payer: Self-pay | Admitting: *Deleted

## 2010-05-22 NOTE — Telephone Encounter (Signed)
Pt's wife wanted you to know that pt has not taken his crestor since leaving the hospital in February.  She says that's why his LDL went up.  Pt will start back taking it on 3/30.

## 2010-05-22 NOTE — Telephone Encounter (Signed)
Patient notified as instructed by telephone. Pt apologized and said after he talked with me yesterday his wife told him that he was not supposed to stop Crestor he was supposed to stop Celebrex. Pt apologized for confusion and said he is starting Crestor 10mg  1 daily and he will watch his diet and will call back for appt.

## 2010-05-22 NOTE — Telephone Encounter (Signed)
From reviewing the notes that I have -- it looks like he should be on crestor 10--- but I may not have everything  It would be great to have a f/u in the next several months

## 2010-05-22 NOTE — Telephone Encounter (Signed)
Please see documentation note dated 05/22/10 at 11:12am response from pt. Thought it had already been routed to Dr Milinda Antis.

## 2010-05-25 ENCOUNTER — Encounter: Payer: Self-pay | Admitting: Cardiology

## 2010-06-05 ENCOUNTER — Encounter: Payer: BC Managed Care – PPO | Admitting: Cardiology

## 2010-06-05 NOTE — Progress Notes (Signed)
[id number] gentleman admitted in February following an abnormal Myoview. Cardiac catheterization performed in February of 2012 revealed 30% LAD, 99% mid followed by 80% RCA, 90% PL, left to right collaterals, EF 55%. Pt had two DES to RCA. Since then,       Current Outpatient Prescriptions  Medication Sig Dispense Refill  . Ascorbic Acid (VITAMIN C) 1000 MG tablet Take 1,000 mg by mouth daily.        Marland Kitchen aspirin 81 MG tablet Take 81 mg by mouth daily.        . fish oil-omega-3 fatty acids 1000 MG capsule Take 1 g by mouth daily.        Marland Kitchen lisinopril (PRINIVIL,ZESTRIL) 10 MG tablet Take 10 mg by mouth daily.        . metoprolol succinate (TOPROL-XL) 25 MG 24 hr tablet Take 25 mg by mouth daily.        . pantoprazole (PROTONIX) 40 MG tablet Take 40 mg by mouth daily.        . prasugrel (EFFIENT) 10 MG TABS Take 10 mg by mouth daily.        . rosuvastatin (CRESTOR) 10 MG tablet Take 10 mg by mouth daily.           Past Medical History  Diagnosis Date  . CAD (coronary artery disease)     s/p Promus DES x 2 RCA 04/08/10;  Cath 2/12: mLAD 30%; CFX mild plaque; mRCA 99% then ulc. 80% tx'd with PCI, dRCA 30%; oPLB 90% (L-R collat); EF 55%  . Abnormal nuclear cardiac imaging test 04/07/10    EF 48%; inferior scar; mild-moderate peri-infarct ischemia  . Hypertension   . Hyperlipidemia   . Bradycardia   . GERD (gastroesophageal reflux disease)   . Colon polyps   . Hemorrhoids     Past Surgical History  Procedure Date  . Tonsillectomy   . Colonoscopy w/ polypectomy 2010    History   Social History  . Marital Status: Married    Spouse Name: N/A    Number of Children: N/A  . Years of Education: N/A   Occupational History  . Not on file.   Social History Main Topics  . Smoking status: Former Games developer  . Smokeless tobacco: Not on file  . Alcohol Use:   . Drug Use:   . Sexually Active:    Other Topics Concern  . Not on file   Social History Narrative  . No narrative on file     ROS: no fevers or chills, productive cough, hemoptysis, dysphasia, odynophagia, melena, hematochezia, dysuria, hematuria, rash, seizure activity, orthopnea, PND, pedal edema, claudication. Remaining systems are negative.  Physical Exam: Well-developed well-nourished in no acute distress.  Skin is warm and dry.  HEENT is normal.  Neck is supple. No thyromegaly.  Chest is clear to auscultation with normal expansion.  Cardiovascular exam is regular rate and rhythm.  Abdominal exam nontender or distended. No masses palpated. Extremities show no edema. neuro grossly intact      This encounter was created in error - please disregard.

## 2010-06-11 ENCOUNTER — Encounter: Payer: Self-pay | Admitting: Cardiology

## 2010-06-23 ENCOUNTER — Encounter: Payer: Self-pay | Admitting: Cardiology

## 2010-06-26 ENCOUNTER — Ambulatory Visit (INDEPENDENT_AMBULATORY_CARE_PROVIDER_SITE_OTHER): Payer: BC Managed Care – PPO | Admitting: Cardiology

## 2010-06-26 ENCOUNTER — Encounter: Payer: Self-pay | Admitting: Cardiology

## 2010-06-26 DIAGNOSIS — I2510 Atherosclerotic heart disease of native coronary artery without angina pectoris: Secondary | ICD-10-CM

## 2010-06-26 DIAGNOSIS — E7800 Pure hypercholesterolemia, unspecified: Secondary | ICD-10-CM

## 2010-06-26 DIAGNOSIS — I10 Essential (primary) hypertension: Secondary | ICD-10-CM

## 2010-06-26 NOTE — Progress Notes (Signed)
HPI: The patient is a 54 year old male who was admitted from the office on 04/07/10 with unstable angina. He had Myoview study that day that was abnormal with inferior scar and mild to moderate peri-Infarct ischemia. EF was 48%. Cardiac catheterization demonstrated 30% LAD stenosis and mild plaque in the circumflex. He had a mid 99% and then an ulcerated 80% stenosis in the RCA. This was treated with 2 drug-eluting stents (Promus). He also had a 90% ostial posterior lateral branch stenosis. There are left to right collaterals. EF 55%. He returns for followup.  Since he was last seen, the patient denies any dyspnea on exertion, orthopnea, PND, pedal edema, palpitations, syncope or chest pain.   Current Outpatient Prescriptions  Medication Sig Dispense Refill  . Ascorbic Acid (VITAMIN C) 1000 MG tablet Take 1,000 mg by mouth daily.        Marland Kitchen aspirin 81 MG tablet Take 81 mg by mouth daily.        . fish oil-omega-3 fatty acids 1000 MG capsule Take 1 g by mouth daily.        Marland Kitchen lisinopril (PRINIVIL,ZESTRIL) 10 MG tablet Take 10 mg by mouth daily.        . metoprolol succinate (TOPROL-XL) 25 MG 24 hr tablet Take 25 mg by mouth daily.        . nitroGLYCERIN (NITROSTAT) 0.4 MG SL tablet Place 0.4 mg under the tongue as directed.        . pantoprazole (PROTONIX) 40 MG tablet Take 40 mg by mouth daily.        . prasugrel (EFFIENT) 10 MG TABS Take 10 mg by mouth daily.        . rosuvastatin (CRESTOR) 10 MG tablet Take 10 mg by mouth daily.           Past Medical History  Diagnosis Date  . CAD (coronary artery disease)     s/p Promus DES x 2 RCA 04/08/10;  Cath 2/12: mLAD 30%; CFX mild plaque; mRCA 99% then ulc. 80% tx'd with PCI, dRCA 30%; oPLB 90% (L-R collat); EF 55%  . Abnormal nuclear cardiac imaging test 04/07/10    EF 48%; inferior scar; mild-moderate peri-infarct ischemia  . Hypertension   . Hyperlipidemia   . GERD (gastroesophageal reflux disease)   . Colon polyps   . Hemorrhoids     Past  Surgical History  Procedure Date  . Tonsillectomy   . Colonoscopy w/ polypectomy 2010  . Left heart catheterization 04/09/10  . Ptca 04/09/10     with placement two overlapping drug-eluting stents in the mid right coronary artery.     History   Social History  . Marital Status: Married    Spouse Name: N/A    Number of Children: N/A  . Years of Education: N/A   Occupational History  . Not on file.   Social History Main Topics  . Smoking status: Former Games developer  . Smokeless tobacco: Not on file   Comment: quit 5 yrs ago  . Alcohol Use: Not on file  . Drug Use: Not on file  . Sexually Active: Not on file   Other Topics Concern  . Not on file   Social History Narrative  . No narrative on file    ROS: no fevers or chills, productive cough, hemoptysis, dysphasia, odynophagia, melena, hematochezia, dysuria, hematuria, rash, seizure activity, orthopnea, PND, pedal edema, claudication. Remaining systems are negative.  Physical Exam: Well-developed well-nourished in no acute distress.  Skin is warm and dry.  HEENT is normal.  Neck is supple. No thyromegaly.  Chest is clear to auscultation with normal expansion.  Cardiovascular exam is regular rate and rhythm.  Abdominal exam nontender or distended. No masses palpated. Extremities show no edema. neuro grossly intact

## 2010-06-26 NOTE — Assessment & Plan Note (Signed)
Continue statin. Check lipids and liver. Goal LDL less than 70. 

## 2010-06-26 NOTE — Assessment & Plan Note (Signed)
Blood pressure controlled. Continue present medications. 

## 2010-06-26 NOTE — Patient Instructions (Signed)
Your physician wants you to follow-up in: 6 MONTHS You will receive a reminder letter in the mail two months in advance. If you don't receive a letter, please call our office to schedule the follow-up appointment. 

## 2010-06-26 NOTE — Assessment & Plan Note (Signed)
Continue present medications and risk factor modification.

## 2010-07-22 ENCOUNTER — Telehealth: Payer: Self-pay | Admitting: Cardiology

## 2010-07-22 ENCOUNTER — Other Ambulatory Visit: Payer: Self-pay | Admitting: Family Medicine

## 2010-07-22 ENCOUNTER — Other Ambulatory Visit: Payer: Self-pay | Admitting: *Deleted

## 2010-07-22 DIAGNOSIS — E7800 Pure hypercholesterolemia, unspecified: Secondary | ICD-10-CM

## 2010-07-22 NOTE — Telephone Encounter (Signed)
Phone is blocked at this call back number.

## 2010-07-22 NOTE — Telephone Encounter (Signed)
Pt wants to talk to a nurse re bruises over his body. Pt does not know why he keep getting bruises.

## 2010-07-23 ENCOUNTER — Other Ambulatory Visit (INDEPENDENT_AMBULATORY_CARE_PROVIDER_SITE_OTHER): Payer: BC Managed Care – PPO | Admitting: Family Medicine

## 2010-07-23 ENCOUNTER — Telehealth: Payer: Self-pay | Admitting: *Deleted

## 2010-07-23 DIAGNOSIS — E7800 Pure hypercholesterolemia, unspecified: Secondary | ICD-10-CM

## 2010-07-23 LAB — HEPATIC FUNCTION PANEL
ALT: 29 U/L (ref 0–53)
AST: 25 U/L (ref 0–37)
Alkaline Phosphatase: 46 U/L (ref 39–117)
Bilirubin, Direct: 0.1 mg/dL (ref 0.0–0.3)
Total Bilirubin: 0.6 mg/dL (ref 0.3–1.2)

## 2010-07-23 LAB — LIPID PANEL
HDL: 47.9 mg/dL (ref >39.00)
Total CHOL/HDL Ratio: 3

## 2010-07-23 NOTE — Telephone Encounter (Signed)
Pt has phone system that states you must identify yourself to get thru--LMTCB on this system--nt

## 2010-07-23 NOTE — Telephone Encounter (Signed)
LMTCB--nt 

## 2010-07-25 NOTE — Telephone Encounter (Signed)
Patient phone is blocked- went through block and left a message on his machine to call, but the block on his number does make it more difficult to reach him.

## 2010-07-25 NOTE — Telephone Encounter (Signed)
Pt not replying to messages left--will close note and wait for pt to call back--nt

## 2010-07-28 NOTE — Telephone Encounter (Signed)
Duplicate telephone encounter

## 2010-07-28 NOTE — Telephone Encounter (Signed)
I left a message for the patient to call. Will forward to [facility].

## 2010-08-05 ENCOUNTER — Telehealth: Payer: Self-pay

## 2010-08-05 NOTE — Telephone Encounter (Signed)
Message copied by Patience Musca on Tue Aug 05, 2010  9:14 AM ------      Message from: Roxy Manns A      Created: Fri Jul 25, 2010 11:16 AM       Cholesterol is much improved -- continue crestor and low sat fat diet       Avoid red meat/ fried foods/ egg yolks/ fatty breakfast meats/ butter, cheese and high fat dairy/ and shellfish

## 2010-08-05 NOTE — Telephone Encounter (Signed)
Patient's wife notified as instructed by telephone. Richard Baldwin has already mailed copy of labs to pt.

## 2010-08-06 NOTE — Telephone Encounter (Signed)
Unable to reach pt or leave a message Debra Mathis  

## 2010-10-23 ENCOUNTER — Other Ambulatory Visit: Payer: Self-pay | Admitting: Cardiovascular Disease

## 2010-11-01 ENCOUNTER — Other Ambulatory Visit: Payer: Self-pay | Admitting: Cardiovascular Disease

## 2010-11-24 ENCOUNTER — Ambulatory Visit (INDEPENDENT_AMBULATORY_CARE_PROVIDER_SITE_OTHER): Payer: BC Managed Care – PPO | Admitting: Cardiology

## 2010-11-24 ENCOUNTER — Encounter: Payer: Self-pay | Admitting: Cardiology

## 2010-11-24 VITALS — BP 156/89 | HR 74 | Ht 73.0 in | Wt 241.0 lb

## 2010-11-24 DIAGNOSIS — E7800 Pure hypercholesterolemia, unspecified: Secondary | ICD-10-CM

## 2010-11-24 DIAGNOSIS — I10 Essential (primary) hypertension: Secondary | ICD-10-CM

## 2010-11-24 DIAGNOSIS — I2510 Atherosclerotic heart disease of native coronary artery without angina pectoris: Secondary | ICD-10-CM

## 2010-11-24 NOTE — Progress Notes (Signed)
HPI: Pleasant male who was admitted from the office on 04/07/10 with unstable angina. He had Myoview study that day that was abnormal with inferior scar and mild to moderate peri-Infarct ischemia. EF was 48%. Cardiac catheterization demonstrated 30% LAD stenosis and mild plaque in the circumflex. He had a mid 99% and then an ulcerated 80% stenosis in the RCA. This was treated with 2 drug-eluting stents (Promus). He also had a 90% ostial posterior lateral branch stenosis. There are left to right collaterals. EF 55%. I last saw him in May of 2012. Since he was last seen, the patient denies any dyspnea on exertion, orthopnea, PND, pedal edema, palpitations, syncope or chest pain.   Current Outpatient Prescriptions  Medication Sig Dispense Refill  . Ascorbic Acid (VITAMIN C) 1000 MG tablet Take 1,000 mg by mouth daily.        Marland Kitchen aspirin 81 MG tablet Take 81 mg by mouth daily.        . CRESTOR 10 MG tablet TAKE 1 TABLET BY MOUTH EVERY MORNING  30 tablet  6  . EFFIENT 10 MG TABS TAKE 1 TABLET BY MOUTH EVERY DAY  30 tablet  6  . fish oil-omega-3 fatty acids 1000 MG capsule Take 1 g by mouth daily.        Marland Kitchen lisinopril (PRINIVIL,ZESTRIL) 10 MG tablet TAKE 1 TABLET BY MOUTH EVERY DAY  30 tablet  6  . metoprolol succinate (TOPROL-XL) 25 MG 24 hr tablet TAKE 1 TABLET BY MOUTH EVERY DAY  30 tablet  6  . nitroGLYCERIN (NITROSTAT) 0.4 MG SL tablet Place 0.4 mg under the tongue as directed.        . pantoprazole (PROTONIX) 40 MG tablet TAKE 1 TABLET BY MOUTH EVERY DAY  30 tablet  6     Past Medical History  Diagnosis Date  . CAD (coronary artery disease)     s/p Promus DES x 2 RCA 04/08/10;  Cath 2/12: mLAD 30%; CFX mild plaque; mRCA 99% then ulc. 80% tx'd with PCI, dRCA 30%; oPLB 90% (L-R collat); EF 55%  . Abnormal nuclear cardiac imaging test 04/07/10    EF 48%; inferior scar; mild-moderate peri-infarct ischemia  . Hypertension   . Hyperlipidemia   . GERD (gastroesophageal reflux disease)   . Colon polyps    . Hemorrhoids     Past Surgical History  Procedure Date  . Tonsillectomy   . Colonoscopy w/ polypectomy 2010  . Left heart catheterization 04/09/10  . Ptca 04/09/10     with placement two overlapping drug-eluting stents in the mid right coronary artery.     History   Social History  . Marital Status: Married    Spouse Name: N/A    Number of Children: N/A  . Years of Education: N/A   Occupational History  . Not on file.   Social History Main Topics  . Smoking status: Former Games developer  . Smokeless tobacco: Not on file   Comment: quit 5 yrs ago  . Alcohol Use: Not on file  . Drug Use: Not on file  . Sexually Active: Not on file   Other Topics Concern  . Not on file   Social History Narrative  . No narrative on file    ROS: no fevers or chills, productive cough, hemoptysis, dysphasia, odynophagia, melena, hematochezia, dysuria, hematuria, rash, seizure activity, orthopnea, PND, pedal edema, claudication. Remaining systems are negative.  Physical Exam: Well-developed well-nourished in no acute distress.  Skin is warm and dry.  HEENT is  normal.  Neck is supple. No thyromegaly.  Chest is clear to auscultation with normal expansion.  Cardiovascular exam is regular rate and rhythm.  Abdominal exam nontender or distended. No masses palpated. Extremities show no edema. neuro grossly intact  ECG NSR, No ST changes

## 2010-11-24 NOTE — Assessment & Plan Note (Signed)
Continue statin. 

## 2010-11-24 NOTE — Assessment & Plan Note (Signed)
Blood pressure mildly elevated but he states typically normal. We will follow this and increase medications as needed.

## 2010-11-24 NOTE — Patient Instructions (Signed)
Your physician wants you to follow-up in: 6 MONTHS You will receive a reminder letter in the mail two months in advance. If you don't receive a letter, please call our office to schedule the follow-up appointment. 

## 2010-11-24 NOTE — Assessment & Plan Note (Signed)
Continue present medications. When he returns in 6 months we'll consider discontinuing effient.

## 2010-12-02 ENCOUNTER — Telehealth: Payer: Self-pay | Admitting: Cardiology

## 2010-12-02 NOTE — Telephone Encounter (Signed)
Spoke with pt, he is not restricted on what if any antibiotics he can take and he was given the okay to get a flu shot. He is aware he can not stop his effient Richard Baldwin

## 2010-12-02 NOTE — Telephone Encounter (Signed)
Pt has questions he forgot to ask, dental office asking which abx if he can't have and if of to have flu shot

## 2010-12-08 ENCOUNTER — Telehealth: Payer: Self-pay | Admitting: *Deleted

## 2010-12-08 DIAGNOSIS — E7800 Pure hypercholesterolemia, unspecified: Secondary | ICD-10-CM

## 2010-12-08 DIAGNOSIS — Z0000 Encounter for general adult medical examination without abnormal findings: Secondary | ICD-10-CM

## 2010-12-08 DIAGNOSIS — I10 Essential (primary) hypertension: Secondary | ICD-10-CM

## 2010-12-08 DIAGNOSIS — Z125 Encounter for screening for malignant neoplasm of prostate: Secondary | ICD-10-CM | POA: Insufficient documentation

## 2010-12-08 NOTE — Telephone Encounter (Signed)
Pt needs a physical before 11/30, per his insurance wellness program.  You dont have any physical slots available.  OK to put two 15 minute slots together?  Do you want to schedule labs first?

## 2010-12-08 NOTE — Telephone Encounter (Signed)
Yes- go ahead and put together 2 slots for the PE  I will go ahead and sched future labs - schedule those before visit Thanks

## 2010-12-09 NOTE — Telephone Encounter (Signed)
Spoke with pt, appt scheduled.  

## 2010-12-23 ENCOUNTER — Ambulatory Visit (INDEPENDENT_AMBULATORY_CARE_PROVIDER_SITE_OTHER): Payer: BC Managed Care – PPO | Admitting: Family Medicine

## 2010-12-23 ENCOUNTER — Encounter: Payer: Self-pay | Admitting: Family Medicine

## 2010-12-23 DIAGNOSIS — J069 Acute upper respiratory infection, unspecified: Secondary | ICD-10-CM

## 2010-12-23 MED ORDER — HYDROCODONE-HOMATROPINE 5-1.5 MG/5ML PO SYRP: 5.0000 mL | ORAL_SOLUTION | Freq: Four times a day (QID) | ORAL | Status: AC | PRN

## 2010-12-23 MED ORDER — AMOXICILLIN 875 MG PO TABS: 875.0000 mg | ORAL_TABLET | Freq: Two times a day (BID) | ORAL | Status: AC

## 2010-12-23 NOTE — Progress Notes (Signed)
duration of symptoms: since 12/14/10. Had been travelling a lot the week prev.   Rhinorrhea: yes congestion:yes ear pain: no sore throat: minimal Cough:some, better now compared to last week Myalgias: no other concerns:voice change noted.  Taking mucinex and otc cough medicine last week.  He's some better today; no chest pain. No fevers.  ROS: See HPI.  Otherwise negative.    Meds, vitals, and allergies reviewed.   GEN: nad, alert and oriented HEENT: mucous membranes moist, TM w/o erythema, nasal epithelium injected, OP with cobblestoning, frontal and max sinus not ttp NECK: supple w/o LA CV: rrr. PULM: ctab, no inc wob EXT: no edema

## 2010-12-23 NOTE — Patient Instructions (Signed)
Try to get some rest.  Take the cough medicine as needed (it can make you drowsy).  Start the antibiotics in a few days if you aren't improved.  Glad to see you.

## 2010-12-24 DIAGNOSIS — J069 Acute upper respiratory infection, unspecified: Secondary | ICD-10-CM | POA: Insufficient documentation

## 2010-12-24 NOTE — Assessment & Plan Note (Signed)
Nontoxic, ctab, some better today. Hold amoxil and use if inc in facial pain over next few days.  Cough medicine in meantime prn, sedation caution and f/u prn.  Supportive tx o/w.  This is likely viral and I expect him to improve and not need the amoxil.  I gave it since he'll be travelling later in the week. He understood.

## 2011-01-05 ENCOUNTER — Other Ambulatory Visit (INDEPENDENT_AMBULATORY_CARE_PROVIDER_SITE_OTHER): Payer: BC Managed Care – PPO

## 2011-01-05 DIAGNOSIS — Z125 Encounter for screening for malignant neoplasm of prostate: Secondary | ICD-10-CM

## 2011-01-05 DIAGNOSIS — E7800 Pure hypercholesterolemia, unspecified: Secondary | ICD-10-CM

## 2011-01-05 DIAGNOSIS — I10 Essential (primary) hypertension: Secondary | ICD-10-CM

## 2011-01-05 DIAGNOSIS — Z0000 Encounter for general adult medical examination without abnormal findings: Secondary | ICD-10-CM

## 2011-01-06 LAB — CBC WITH DIFFERENTIAL/PLATELET
Basophils Absolute: 0 10*3/uL (ref 0.0–0.1)
Eosinophils Absolute: 0.3 10*3/uL (ref 0.0–0.7)
Hemoglobin: 14.8 g/dL (ref 13.0–17.0)
Lymphocytes Relative: 14.4 % (ref 12.0–46.0)
Monocytes Relative: 6.1 % (ref 3.0–12.0)
Neutrophils Relative %: 77 % (ref 43.0–77.0)
Platelets: 278 10*3/uL (ref 150.0–400.0)
RDW: 13.1 % (ref 11.5–14.6)

## 2011-01-06 LAB — LIPID PANEL
Total CHOL/HDL Ratio: 4
VLDL: 24.8 mg/dL (ref 0.0–40.0)

## 2011-01-06 LAB — COMPREHENSIVE METABOLIC PANEL
ALT: 30 U/L (ref 0–53)
AST: 27 U/L (ref 0–37)
CO2: 27 mEq/L (ref 19–32)
Calcium: 9.8 mg/dL (ref 8.4–10.5)
Chloride: 103 mEq/L (ref 96–112)
GFR: 71.45 mL/min (ref >60.00)
Potassium: 4.5 mEq/L (ref 3.5–5.1)
Sodium: 139 mEq/L (ref 135–145)
Total Protein: 7 g/dL (ref 6.0–8.3)

## 2011-01-06 LAB — PSA: PSA: 1.95 ng/mL (ref 0.10–4.00)

## 2011-01-06 LAB — TSH: TSH: 0.76 u[IU]/mL (ref 0.35–5.50)

## 2011-01-13 ENCOUNTER — Encounter: Payer: Self-pay | Admitting: Family Medicine

## 2011-01-14 ENCOUNTER — Ambulatory Visit (INDEPENDENT_AMBULATORY_CARE_PROVIDER_SITE_OTHER): Payer: BC Managed Care – PPO | Admitting: Family Medicine

## 2011-01-14 ENCOUNTER — Encounter: Payer: Self-pay | Admitting: Family Medicine

## 2011-01-14 VITALS — BP 130/80 | HR 84 | Temp 98.1°F | Ht 70.5 in | Wt 238.0 lb

## 2011-01-14 DIAGNOSIS — Z0000 Encounter for general adult medical examination without abnormal findings: Secondary | ICD-10-CM

## 2011-01-14 DIAGNOSIS — E7800 Pure hypercholesterolemia, unspecified: Secondary | ICD-10-CM

## 2011-01-14 DIAGNOSIS — I2510 Atherosclerotic heart disease of native coronary artery without angina pectoris: Secondary | ICD-10-CM

## 2011-01-14 DIAGNOSIS — Z125 Encounter for screening for malignant neoplasm of prostate: Secondary | ICD-10-CM

## 2011-01-14 DIAGNOSIS — I10 Essential (primary) hypertension: Secondary | ICD-10-CM

## 2011-01-14 DIAGNOSIS — Z23 Encounter for immunization: Secondary | ICD-10-CM

## 2011-01-14 MED ORDER — PANTOPRAZOLE SODIUM 40 MG PO TBEC: 40.0000 mg | DELAYED_RELEASE_TABLET | Freq: Every day | ORAL | Status: DC

## 2011-01-14 MED ORDER — ROSUVASTATIN CALCIUM 10 MG PO TABS: 10.0000 mg | ORAL_TABLET | Freq: Every day | ORAL | Status: DC

## 2011-01-14 MED ORDER — LISINOPRIL 10 MG PO TABS: 10.0000 mg | ORAL_TABLET | Freq: Every day | ORAL | Status: DC

## 2011-01-14 MED ORDER — METOPROLOL SUCCINATE ER 25 MG PO TB24: 25.0000 mg | ORAL_TABLET | Freq: Every day | ORAL | Status: DC

## 2011-01-14 NOTE — Patient Instructions (Addendum)
Flu shot today Keep working on healthy diet and continue cholesterol medicine Avoid red meat/ fried foods/ egg yolks/ fatty breakfast meats/ butter, cheese and high fat dairy/ and shellfish   Blood pressure was 130/80 on 2nd check today Best meter to get is OMRON for the arm size regular- to check at home  Prostate is stable - ok  If cough or other symptoms return/ worsen , please alert me  F/u 6 mo with labs prior

## 2011-01-14 NOTE — Assessment & Plan Note (Signed)
Reviewed health habits including diet and exercise and skin cancer prevention Also reviewed health mt list, fam hx and immunizations   Rev wellness labs Flu shot today

## 2011-01-14 NOTE — Assessment & Plan Note (Signed)
S/p stents feeling good/ exercising and working on risk factors For frequent cardiac f/u  bp 130/80 today  Rev cholesterol

## 2011-01-14 NOTE — Assessment & Plan Note (Signed)
psa and DRE stable without symptoms today

## 2011-01-14 NOTE — Progress Notes (Signed)
Subjective:    Patient ID: Richard Baldwin, male    DOB: 11-04-1955, 55 y.o.   MRN: [id number]  HPI Here for health mt and to review health mt  Had CAD with stents in feb - feeling much much better   Had a bout of bronchitis this fall - and took abx and cough med  Made him a bit loopy  Is feeling better from that  Wants a flu shot today   Lab Results  Component Value Date   CHOL 171 01/05/2011   CHOL 146 07/23/2010   CHOL 197 05/20/2010   Lab Results  Component Value Date   HDL 47.50 01/05/2011   HDL 47.90 07/23/2010   HDL 82.95 05/20/2010   Lab Results  Component Value Date   LDLCALC 99 01/05/2011   LDLCALC 78 07/23/2010   LDLCALC 118* 05/20/2010   Lab Results  Component Value Date   TRIG 124.0 01/05/2011   TRIG 103.0 07/23/2010   TRIG 156.0* 05/20/2010   Lab Results  Component Value Date   CHOLHDL 4 01/05/2011   CHOLHDL 3 07/23/2010   CHOLHDL 4 05/20/2010   Lab Results  Component Value Date   LDLDIRECT 124.5 08/19/2009   LDLDIRECT 105.3 03/04/2009   LDLDIRECT 140.1 08/15/2008   is on crestor 10 mg -is tolerating well , no problems with that   First bp today 140/80 - a little higher than usual  130s at cardiol office - goal to get it at 130/80   Wt is stable overall -- - may have lost a bit   (got down to 222-- wants to loose again )  Exercises 4-5 times per week- is very good with that  Is making very good food choices overall   Td --09 Will be due for pneumovax next year - pt is aware   Lab Results  Component Value Date   WBC 12.0* 01/05/2011   HGB 14.8 01/05/2011   HCT 43.3 01/05/2011   MCV 94.6 01/05/2011   PLT 278.0 01/05/2011   had a uri at the time -- dry cough  Is getting better   psa normal at 1.95  No prostate problems  Nocturia no more than once as a rule   Still working a lot -- especially in the past few months  Does not let things worry him like they used to   Patient Active Problem List  Diagnoses  . HYPERCHOLESTEROLEMIA  .  HYPERTENSION  . HEMORRHOIDS, WITH BLEEDING  . GERD  . COUGH  . TOBACCO USE, QUIT  . CHEST PAIN  . ABNORMAL EKG  . UNSTABLE ANGINA  . CAD (coronary artery disease)  . Routine general medical examination at a health care facility  . Prostate cancer screening  . URI (upper respiratory infection)   Past Medical History  Diagnosis Date  . CAD (coronary artery disease)     s/p Promus DES x 2 RCA 04/08/10;  Cath 2/12: mLAD 30%; CFX mild plaque; mRCA 99% then ulc. 80% tx'd with PCI, dRCA 30%; oPLB 90% (L-R collat); EF 55%  . Abnormal nuclear cardiac imaging test 04/07/10    EF 48%; inferior scar; mild-moderate peri-infarct ischemia  . Hypertension   . Hyperlipidemia   . GERD (gastroesophageal reflux disease)   . Colon polyps   . Hemorrhoids    Past Surgical History  Procedure Date  . Tonsillectomy   . Colonoscopy w/ polypectomy 2010  . Left heart catheterization 04/09/10  . Ptca 04/09/10     with placement  two overlapping drug-eluting stents in the mid right coronary artery.    History  Substance Use Topics  . Smoking status: Former Smoker    Types: Cigarettes    Quit date: 02/23/2005  . Smokeless tobacco: Not on file   Comment: Quit smoking 02/2005.  Marland Kitchen Alcohol Use: Not on file   Family History  Problem Relation Age of Onset  . Heart attack Father   . Diabetes Father   . Heart disease Father     CAD  . Heart disease Other     Strong family hx of CAD  . Sudden death Cousin     ? Cardiac   Allergies  Allergen Reactions  . Atorvastatin     REACTION: elevated liver functions  . Butenafine     REACTION: burned  . Varenicline Tartrate     REACTION: sleepy   Current Outpatient Prescriptions on File Prior to Visit  Medication Sig Dispense Refill  . Ascorbic Acid (VITAMIN C) 1000 MG tablet Take 1,000 mg by mouth daily.        Marland Kitchen aspirin 81 MG tablet Take 81 mg by mouth daily.        Marland Kitchen EFFIENT 10 MG TABS TAKE 1 TABLET BY MOUTH EVERY DAY  30 tablet  6  . fish oil-omega-3  fatty acids 1000 MG capsule Take 1 g by mouth daily.        . nitroGLYCERIN (NITROSTAT) 0.4 MG SL tablet Place 0.4 mg under the tongue as directed.                  Review of Systems Review of Systems  Constitutional: Negative for fever, appetite change, fatigue and unexpected weight change.  Eyes: Negative for pain and visual disturbance.  Respiratory: Negative for cough and shortness of breath.   Cardiovascular: Negative for cp or palpitations    Gastrointestinal: Negative for nausea, diarrhea and constipation.  Genitourinary: Negative for urgency and frequency.  Skin: Negative for pallor or rash   Neurological: Negative for weakness, light-headedness, numbness and headaches.  Hematological: Negative for adenopathy. Does not bruise/bleed easily.  Psychiatric/Behavioral: Negative for dysphoric mood. The patient is not nervous/anxious.          Objective:   Physical Exam  Constitutional: He appears well-developed and well-nourished. No distress.       overwt and well appearing   HENT:  Head: Normocephalic and atraumatic.  Right Ear: External ear normal.  Left Ear: External ear normal.  Nose: Nose normal.  Mouth/Throat: Oropharynx is clear and moist.  Eyes: Conjunctivae and EOM are normal. Pupils are equal, round, and reactive to light. No scleral icterus.  Neck: Normal range of motion. Neck supple. No JVD present. Carotid bruit is not present. No thyromegaly present.  Cardiovascular: Normal rate, regular rhythm, normal heart sounds and intact distal pulses.  Exam reveals no gallop.   Pulmonary/Chest: Effort normal and breath sounds normal. No respiratory distress. He has no wheezes.  Abdominal: Soft. Bowel sounds are normal. He exhibits no distension, no abdominal bruit and no mass. There is no tenderness.  Genitourinary: Rectum normal and prostate normal. Guaiac negative stool.  Musculoskeletal: Normal range of motion. He exhibits no edema and no tenderness.    Lymphadenopathy:    He has no cervical adenopathy.  Neurological: He is alert. He has normal reflexes. No cranial nerve deficit. He exhibits normal muscle tone. Coordination normal.  Skin: Skin is warm and dry. No rash noted. No erythema. No pallor.  Psychiatric: He has  a normal mood and affect.          Assessment & Plan:

## 2011-01-14 NOTE — Assessment & Plan Note (Signed)
bp improved on 2nd check with 130/80 - at goal Pt will get OMRON cuff for home- inst how and when to check it  No change in meds Refilled  F/u 6 mo

## 2011-01-14 NOTE — Assessment & Plan Note (Signed)
Improved with crestor  Disc goals for lipids and reasons to control them Rev labs with pt  (LDL calc is 99) Rev low sat fat diet in detail  Refilled med F/u 6 months

## 2011-05-18 ENCOUNTER — Encounter: Payer: Self-pay | Admitting: Cardiology

## 2011-05-18 ENCOUNTER — Ambulatory Visit (INDEPENDENT_AMBULATORY_CARE_PROVIDER_SITE_OTHER): Payer: BC Managed Care – PPO | Admitting: Cardiology

## 2011-05-18 VITALS — BP 144/85 | HR 72 | Ht 72.0 in | Wt 236.0 lb

## 2011-05-18 DIAGNOSIS — I10 Essential (primary) hypertension: Secondary | ICD-10-CM

## 2011-05-18 DIAGNOSIS — I2510 Atherosclerotic heart disease of native coronary artery without angina pectoris: Secondary | ICD-10-CM

## 2011-05-18 MED ORDER — PANTOPRAZOLE SODIUM 40 MG PO TBEC: 40.0000 mg | DELAYED_RELEASE_TABLET | Freq: Every day | ORAL | Status: DC

## 2011-05-18 MED ORDER — LISINOPRIL 10 MG PO TABS: 10.0000 mg | ORAL_TABLET | Freq: Every day | ORAL | Status: DC

## 2011-05-18 MED ORDER — METOPROLOL SUCCINATE ER 25 MG PO TB24: 25.0000 mg | ORAL_TABLET | Freq: Every day | ORAL | Status: DC

## 2011-05-18 NOTE — Assessment & Plan Note (Signed)
Continue aspirin. Discontinue effient.

## 2011-05-18 NOTE — Progress Notes (Signed)
HPI: Pleasant male who was admitted from the office on 04/07/10 with unstable angina. He had Myoview study that day that was abnormal with inferior scar and mild to moderate peri-Infarct ischemia. EF was 48%. Cardiac catheterization demonstrated 30% LAD stenosis and mild plaque in the circumflex. He had a mid 99% and then an ulcerated 80% stenosis in the RCA. This was treated with 2 drug-eluting stents (Promus). He also had a 90% ostial posterior lateral branch stenosis. There are left to right collaterals. EF 55%. I last saw him in Oct of 2012. Since he was last seen, the patient denies any dyspnea on exertion, orthopnea, PND, pedal edema, palpitations, syncope or chest pain.   Current Outpatient Prescriptions  Medication Sig Dispense Refill  . Ascorbic Acid (VITAMIN C) 1000 MG tablet Take 1,000 mg by mouth daily.        Marland Kitchen aspirin 81 MG tablet Take 81 mg by mouth daily.        Marland Kitchen EFFIENT 10 MG TABS TAKE 1 TABLET BY MOUTH EVERY DAY  30 tablet  6  . fish oil-omega-3 fatty acids 1000 MG capsule Take 1 g by mouth daily.        Marland Kitchen lisinopril (PRINIVIL,ZESTRIL) 10 MG tablet Take 1 tablet (10 mg total) by mouth daily.  30 tablet  11  . metoprolol succinate (TOPROL-XL) 25 MG 24 hr tablet Take 1 tablet (25 mg total) by mouth daily.  30 tablet  11  . nitroGLYCERIN (NITROSTAT) 0.4 MG SL tablet Place 0.4 mg under the tongue as directed.        . pantoprazole (PROTONIX) 40 MG tablet Take 1 tablet (40 mg total) by mouth daily.  30 tablet  11  . rosuvastatin (CRESTOR) 10 MG tablet Take 1 tablet (10 mg total) by mouth daily.  30 tablet  11     Past Medical History  Diagnosis Date  . CAD (coronary artery disease)     s/p Promus DES x 2 RCA 04/08/10;  Cath 2/12: mLAD 30%; CFX mild plaque; mRCA 99% then ulc. 80% tx'd with PCI, dRCA 30%; oPLB 90% (L-R collat); EF 55%  . Abnormal nuclear cardiac imaging test 04/07/10    EF 48%; inferior scar; mild-moderate peri-infarct ischemia  . Hypertension   .  Hyperlipidemia   . GERD (gastroesophageal reflux disease)   . Colon polyps   . Hemorrhoids     Past Surgical History  Procedure Date  . Tonsillectomy   . Colonoscopy w/ polypectomy 2010  . Left heart catheterization 04/09/10  . Ptca 04/09/10     with placement two overlapping drug-eluting stents in the mid right coronary artery.     History   Social History  . Marital Status: Married    Spouse Name: N/A    Number of Children: N/A  . Years of Education: N/A   Occupational History  . Not on file.   Social History Main Topics  . Smoking status: Former Smoker    Types: Cigarettes    Quit date: 02/23/2005  . Smokeless tobacco: Not on file   Comment: Quit smoking 02/2005.  Marland Kitchen Alcohol Use: Not on file  . Drug Use: Not on file  . Sexually Active: Not on file   Other Topics Concern  . Not on file   Social History Narrative   Regular exercise at gym.    ROS: no fevers or chills, productive cough, hemoptysis, dysphasia, odynophagia, melena, hematochezia, dysuria, hematuria, rash, seizure activity, orthopnea, PND, pedal edema, claudication. Remaining systems  are negative.  Physical Exam: Well-developed well-nourished in no acute distress.  Skin is warm and dry.  HEENT is normal.  Neck is supple. No thyromegaly.  Chest is clear to auscultation with normal expansion.  Cardiovascular exam is regular rate and rhythm.  Abdominal exam nontender or distended. No masses palpated. Extremities show no edema. neuro grossly intact  ECG sinus rhythm at a rate of 72. No ST changes

## 2011-05-18 NOTE — Assessment & Plan Note (Signed)
Blood pressure mildly elevated. He will follow this at home and we will increase medications as needed. I have explained that his systolic should be less than 140 and his diastolic less than 85.

## 2011-05-18 NOTE — Assessment & Plan Note (Signed)
Continue statin. Lipids and liver monitored by primary care. 

## 2011-05-18 NOTE — Patient Instructions (Signed)
Your physician wants you to follow-up in: ONE YEAR You will receive a reminder letter in the mail two months in advance. If you don't receive a letter, please call our office to schedule the follow-up appointment.   STOP EFFIENT 

## 2011-05-21 ENCOUNTER — Other Ambulatory Visit: Payer: Self-pay | Admitting: Cardiovascular Disease

## 2011-10-20 ENCOUNTER — Telehealth: Payer: Self-pay | Admitting: Family Medicine

## 2011-10-20 NOTE — Telephone Encounter (Signed)
Go ahead and put him in for any 30 min avail

## 2011-10-20 NOTE — Telephone Encounter (Signed)
Patient's wife called.  Patient has to have a cpx before the end of November for his insurance at work. Your first available is in January. Patient's wife is asking if you'll see him for a physical before the end of November,otherwise she wants him to see another doctor at our office for the physical.

## 2011-11-16 ENCOUNTER — Encounter: Payer: Self-pay | Admitting: Family Medicine

## 2011-11-16 ENCOUNTER — Ambulatory Visit (INDEPENDENT_AMBULATORY_CARE_PROVIDER_SITE_OTHER): Payer: BC Managed Care – PPO | Admitting: Family Medicine

## 2011-11-16 VITALS — BP 148/90 | HR 68 | Temp 97.5°F | Ht 73.0 in | Wt 237.8 lb

## 2011-11-16 DIAGNOSIS — L03314 Cellulitis of groin: Secondary | ICD-10-CM

## 2011-11-16 DIAGNOSIS — L02219 Cutaneous abscess of trunk, unspecified: Secondary | ICD-10-CM

## 2011-11-16 DIAGNOSIS — L03319 Cellulitis of trunk, unspecified: Secondary | ICD-10-CM

## 2011-11-16 MED ORDER — CEPHALEXIN 500 MG PO CAPS: 500.0000 mg | ORAL_CAPSULE | Freq: Three times a day (TID) | ORAL | Status: DC

## 2011-11-16 NOTE — Assessment & Plan Note (Addendum)
3-4 cm area of firm induration and erythema in L groin- over scrotum- that is mildly tender Will tx with keflex  Disc hygiene and use of warm compresses, this may or may not turn into an abscess Will update if worse at all  F/u fri for re check  I and D if this softens up

## 2011-11-16 NOTE — Patient Instructions (Addendum)
I think you have a skin infection - it may or may not have come from an ingrown hair or boil It may soften up , become an abcess and drain - or possibly just go away Start keflex- antibiotic as directed Use warm compresses whenever you can for 10 minutes at a time  Clean area with antibacterial soap and water If it starts to drain- that is ok -- just put some gauze around it and let me know  If this becomes bigger or more painful , or you develop fever - call asap  Follow up with me on Friday please for a re check

## 2011-11-16 NOTE — Progress Notes (Signed)
Subjective:    Patient ID: Richard Baldwin, male    DOB: 12-13-1955, 56 y.o.   MRN: [id number]  HPI Here for a lump in the groin  Friday am - felt a lump -- to the L of his groin/scrotum- and thought it could be a boil of some sort Had to travel and got larger Now is golfball size perhaps - and the skin is very red , and somewhat tender  Is not throbbing , but uncomfortable and aware   No fever   Area has not drained  No hx of hernia   Patient Active Problem List  Diagnosis  . HYPERCHOLESTEROLEMIA  . HYPERTENSION  . HEMORRHOIDS, WITH BLEEDING  . GERD  . COUGH  . TOBACCO USE, QUIT  . CHEST PAIN  . ABNORMAL EKG  . UNSTABLE ANGINA  . CAD (coronary artery disease)  . Routine general medical examination at a health care facility  . Prostate cancer screening  . URI (upper respiratory infection)   Past Medical History  Diagnosis Date  . CAD (coronary artery disease)     s/p Promus DES x 2 RCA 04/08/10;  Cath 2/12: mLAD 30%; CFX mild plaque; mRCA 99% then ulc. 80% tx'd with PCI, dRCA 30%; oPLB 90% (L-R collat); EF 55%  . Abnormal nuclear cardiac imaging test 04/07/10    EF 48%; inferior scar; mild-moderate peri-infarct ischemia  . Hypertension   . Hyperlipidemia   . GERD (gastroesophageal reflux disease)   . Colon polyps   . Hemorrhoids    Past Surgical History  Procedure Date  . Tonsillectomy   . Colonoscopy w/ polypectomy 2010  . Left heart catheterization 04/09/10  . Ptca 04/09/10     with placement two overlapping drug-eluting stents in the mid right coronary artery.    History  Substance Use Topics  . Smoking status: Former Smoker    Types: Cigarettes    Quit date: 02/23/2005  . Smokeless tobacco: Not on file   Comment: Quit smoking 02/2005.  Marland Kitchen Alcohol Use: No   Family History  Problem Relation Age of Onset  . Heart attack Father   . Diabetes Father   . Heart disease Father     CAD  . Heart disease Other     Strong family hx of CAD  . Sudden  death Cousin     ? Cardiac   Allergies  Allergen Reactions  . Atorvastatin     REACTION: elevated liver functions  . Butenafine     REACTION: burned  . Varenicline Tartrate     REACTION: sleepy   Current Outpatient Prescriptions on File Prior to Visit  Medication Sig Dispense Refill  . Ascorbic Acid (VITAMIN C) 1000 MG tablet Take 1,000 mg by mouth daily.        Marland Kitchen aspirin 81 MG tablet Take 81 mg by mouth daily.        . fish oil-omega-3 fatty acids 1000 MG capsule Take 1 g by mouth daily.        Marland Kitchen lisinopril (PRINIVIL,ZESTRIL) 10 MG tablet Take 1 tablet (10 mg total) by mouth daily.  30 tablet  11  . metoprolol succinate (TOPROL-XL) 25 MG 24 hr tablet Take 1 tablet (25 mg total) by mouth daily.  30 tablet  11  . nitroGLYCERIN (NITROSTAT) 0.4 MG SL tablet Place 0.4 mg under the tongue as directed.        . pantoprazole (PROTONIX) 40 MG tablet Take 1 tablet (40 mg total) by mouth daily.  30 tablet  11  . rosuvastatin (CRESTOR) 10 MG tablet Take 1 tablet (10 mg total) by mouth daily.  30 tablet  11      Review of Systems    Review of Systems  Constitutional: Negative for fever, appetite change, fatigue and unexpected weight change.  Eyes: Negative for pain and visual disturbance.  Respiratory: Negative for cough and shortness of breath.   Cardiovascular: Negative for cp or palpitations    Gastrointestinal: Negative for nausea, diarrhea and constipation. neg for blood in stool or rectal pain  Genitourinary: Negative for urgency and frequency. neg for blood in urine  Skin: Negative for pallor or rash  pos for redness/ sore area in groin Neurological: Negative for weakness, light-headedness, numbness and headaches.  Hematological: Negative for adenopathy. Does not bruise/bleed easily.  Psychiatric/Behavioral: Negative for dysphoric mood. The patient is not nervous/anxious.      Objective:   Physical Exam  Constitutional: He appears well-developed and well-nourished. No distress.    HENT:  Head: Normocephalic and atraumatic.  Eyes: Conjunctivae normal and EOM are normal. Pupils are equal, round, and reactive to light.  Cardiovascular: Normal rate and regular rhythm.   Abdominal: Soft. Bowel sounds are normal. He exhibits no distension and no mass. There is no tenderness. There is no rebound and no guarding.  Genitourinary: Penis normal.       No testicular masses  Skin: Skin is warm and dry. No rash noted. There is erythema. No pallor.       Area on L just above penis and scrotum is inflamed  3-4 cm area of induration (firm) with erythema and mild warmth Slightly tender No open skin or drainage  Not fluctuant  Psychiatric: He has a normal mood and affect.          Assessment & Plan:

## 2011-11-20 ENCOUNTER — Ambulatory Visit: Payer: BC Managed Care – PPO | Admitting: Family Medicine

## 2011-11-25 ENCOUNTER — Telehealth: Payer: Self-pay | Admitting: Cardiology

## 2011-11-25 NOTE — Telephone Encounter (Signed)
Spoke with pt, Aware of dr crenshaw's recommendations.  °

## 2011-11-25 NOTE — Telephone Encounter (Signed)
Fu as scheduled Brian Crenshaw   

## 2011-11-25 NOTE — Telephone Encounter (Signed)
Spoke with pt, he has been tracking his bp at home and for the last 10 days it is running upper 130's-140 and upper 80's and 90. He has a follow up scheduled for wed next week. Will forward for dr Jens Som review

## 2011-11-25 NOTE — Telephone Encounter (Signed)
Pt calling re problem with BP °

## 2011-12-02 ENCOUNTER — Other Ambulatory Visit: Payer: Self-pay | Admitting: Cardiovascular Disease

## 2011-12-02 ENCOUNTER — Encounter: Payer: Self-pay | Admitting: Cardiology

## 2011-12-02 ENCOUNTER — Ambulatory Visit (INDEPENDENT_AMBULATORY_CARE_PROVIDER_SITE_OTHER): Payer: BC Managed Care – PPO | Admitting: Cardiology

## 2011-12-02 ENCOUNTER — Other Ambulatory Visit: Payer: Self-pay | Admitting: *Deleted

## 2011-12-02 VITALS — BP 151/88 | HR 76 | Wt 238.0 lb

## 2011-12-02 DIAGNOSIS — E7800 Pure hypercholesterolemia, unspecified: Secondary | ICD-10-CM

## 2011-12-02 DIAGNOSIS — I2510 Atherosclerotic heart disease of native coronary artery without angina pectoris: Secondary | ICD-10-CM

## 2011-12-02 DIAGNOSIS — I10 Essential (primary) hypertension: Secondary | ICD-10-CM

## 2011-12-02 MED ORDER — LISINOPRIL 10 MG PO TABS: 20.0000 mg | ORAL_TABLET | Freq: Every day | ORAL | Status: DC

## 2011-12-02 NOTE — Progress Notes (Signed)
HPI: Pleasant male who was admitted from the office on 04/07/10 with unstable angina. He had Myoview study that day that was abnormal with inferior scar and mild to moderate peri-Infarct ischemia. EF was 48%. Cardiac catheterization demonstrated 30% LAD stenosis and mild plaque in the circumflex. He had a mid 99% and then an ulcerated 80% stenosis in the RCA. This was treated with 2 drug-eluting stents (Promus). He also had a 90% ostial posterior lateral branch stenosis. There are left to right collaterals. EF 55%. I last saw him in March of 2013. Since he was last seen, there is no dyspnea on exertion, orthopnea, PND or pedal edema. There is no exertional chest pain. He has noticed some soreness in his chest that increases with movements. He has also noticed that his blood pressure is running high.  Current Outpatient Prescriptions  Medication Sig Dispense Refill  . Ascorbic Acid (VITAMIN C) 1000 MG tablet Take 1,000 mg by mouth daily.        Marland Kitchen aspirin 81 MG tablet Take 81 mg by mouth daily.        . fish oil-omega-3 fatty acids 1000 MG capsule Take 1 g by mouth daily.        Marland Kitchen lisinopril (PRINIVIL,ZESTRIL) 10 MG tablet Take 1 tablet (10 mg total) by mouth daily.  30 tablet  11  . metoprolol succinate (TOPROL-XL) 25 MG 24 hr tablet Take 1 tablet (25 mg total) by mouth daily.  30 tablet  11  . nitroGLYCERIN (NITROSTAT) 0.4 MG SL tablet Place 0.4 mg under the tongue as directed.        . pantoprazole (PROTONIX) 40 MG tablet Take 1 tablet (40 mg total) by mouth daily.  30 tablet  11  . rosuvastatin (CRESTOR) 10 MG tablet Take 1 tablet (10 mg total) by mouth daily.  30 tablet  11     Past Medical History  Diagnosis Date  . CAD (coronary artery disease)     s/p Promus DES x 2 RCA 04/08/10;  Cath 2/12: mLAD 30%; CFX mild plaque; mRCA 99% then ulc. 80% tx'd with PCI, dRCA 30%; oPLB 90% (L-R collat); EF 55%  . Abnormal nuclear cardiac imaging test 04/07/10    EF 48%; inferior scar; mild-moderate  peri-infarct ischemia  . Hypertension   . Hyperlipidemia   . GERD (gastroesophageal reflux disease)   . Colon polyps   . Hemorrhoids     Past Surgical History  Procedure Date  . Tonsillectomy   . Colonoscopy w/ polypectomy 2010  . Left heart catheterization 04/09/10  . Ptca 04/09/10     with placement two overlapping drug-eluting stents in the mid right coronary artery.     History   Social History  . Marital Status: Married    Spouse Name: N/A    Number of Children: N/A  . Years of Education: N/A   Occupational History  . Not on file.   Social History Main Topics  . Smoking status: Former Smoker    Types: Cigarettes    Quit date: 02/23/2005  . Smokeless tobacco: Not on file   Comment: Quit smoking 02/2005.  Marland Kitchen Alcohol Use: No  . Drug Use: No  . Sexually Active: Not on file   Other Topics Concern  . Not on file   Social History Narrative   Regular exercise at gym.    ROS: no fevers or chills, productive cough, hemoptysis, dysphasia, odynophagia, melena, hematochezia, dysuria, hematuria, rash, seizure activity, orthopnea, PND, pedal edema, claudication. Remaining  systems are negative.  Physical Exam: Well-developed well-nourished in no acute distress.  Skin is warm and dry.  HEENT is normal.  Neck is supple.  Chest is clear to auscultation with normal expansion.  Cardiovascular exam is regular rate and rhythm.  Abdominal exam nontender or distended. No masses palpated. Extremities show no edema. neuro grossly intact  ECG sinus rhythm at a rate of 76. No ST changes.

## 2011-12-02 NOTE — Assessment & Plan Note (Signed)
Continue aspirin and statin. He has described some chest soreness unlike his previous cardiac pain and sounds musculoskeletal. Electrocardiogram normal.

## 2011-12-02 NOTE — Patient Instructions (Signed)
Your physician recommends that you schedule a follow-up appointment in:  3 MONTHS WITH DR Jens Som Your physician has recommended you make the following change in your medication: INCREASE LISINOPRIL TO 20 MG EVERY DAY  Your physician recommends that you return for lab work in:  FASTING  IN  WEEK BMET LIPID LIVER  DX V58.69  272.4

## 2011-12-02 NOTE — Telephone Encounter (Signed)
..   Requested Prescriptions   Pending Prescriptions Disp Refills  . NITROSTAT 0.4 MG SL tablet [Pharmacy Med Name: NITROSTAT 0.4MG  TABLET SL] 25 tablet 2    Sig: PLACE 1 TABLET UNDER TONGUE EVERY 5 MINS FOR 3 DOSES AS NEEDED

## 2011-12-02 NOTE — Assessment & Plan Note (Signed)
Patient's blood pressure is elevated. Increase lisinopril to 20 mg daily. Check potassium and renal function in one week. He will continue to track his pressure at home and we will adjust medicines as needed.

## 2011-12-02 NOTE — Assessment & Plan Note (Signed)
Continue statin. Check lipids and liver. Goal LDL less than 70. 

## 2011-12-10 ENCOUNTER — Other Ambulatory Visit: Payer: BC Managed Care – PPO

## 2011-12-10 ENCOUNTER — Other Ambulatory Visit (INDEPENDENT_AMBULATORY_CARE_PROVIDER_SITE_OTHER): Payer: BC Managed Care – PPO

## 2011-12-10 DIAGNOSIS — E785 Hyperlipidemia, unspecified: Secondary | ICD-10-CM

## 2011-12-10 DIAGNOSIS — Z79899 Other long term (current) drug therapy: Secondary | ICD-10-CM

## 2011-12-10 LAB — HEPATIC FUNCTION PANEL
ALT: 42 U/L (ref 0–53)
AST: 27 U/L (ref 0–37)
Bilirubin, Direct: 0.1 mg/dL (ref 0.0–0.3)
Total Bilirubin: 0.6 mg/dL (ref 0.3–1.2)
Total Protein: 7.1 g/dL (ref 6.0–8.3)

## 2011-12-10 LAB — BASIC METABOLIC PANEL
CO2: 30 mEq/L (ref 19–32)
Chloride: 102 mEq/L (ref 96–112)
Potassium: 5 mEq/L (ref 3.5–5.1)

## 2011-12-10 LAB — LIPID PANEL
LDL Cholesterol: 92 mg/dL (ref 0–99)
Total CHOL/HDL Ratio: 4

## 2011-12-16 ENCOUNTER — Telehealth: Payer: Self-pay | Admitting: *Deleted

## 2011-12-16 DIAGNOSIS — E785 Hyperlipidemia, unspecified: Secondary | ICD-10-CM

## 2011-12-16 MED ORDER — ROSUVASTATIN CALCIUM 20 MG PO TABS: 20.0000 mg | ORAL_TABLET | Freq: Every day | ORAL | Status: DC

## 2011-12-16 NOTE — Telephone Encounter (Signed)
lmptcb to go over lab results and to increase crestor to 20 mg daily w/repeat L/L 6 weeks

## 2011-12-16 NOTE — Telephone Encounter (Signed)
Message copied by Tarri Fuller on Wed Dec 16, 2011 12:22 PM ------      Message from: Lewayne Bunting      Created: Thu Dec 10, 2011 12:17 PM       Change crestor to 20 mg po daily; lipids and liver in six weeks      Olga Millers

## 2012-01-07 ENCOUNTER — Telehealth: Payer: Self-pay | Admitting: Family Medicine

## 2012-01-07 DIAGNOSIS — Z125 Encounter for screening for malignant neoplasm of prostate: Secondary | ICD-10-CM

## 2012-01-07 DIAGNOSIS — Z0000 Encounter for general adult medical examination without abnormal findings: Secondary | ICD-10-CM

## 2012-01-07 DIAGNOSIS — E7800 Pure hypercholesterolemia, unspecified: Secondary | ICD-10-CM

## 2012-01-07 NOTE — Telephone Encounter (Signed)
Message copied by Judy Pimple on Thu Jan 07, 2012  8:35 PM ------      Message from: Baldomero Lamy      Created: Tue Jan 05, 2012 11:33 AM      Regarding: Cpx labs 11/15 Fri       Please order  future cpx labs for pt's upcomming lab appt.      Thanks      Rodney Booze

## 2012-01-08 ENCOUNTER — Other Ambulatory Visit (INDEPENDENT_AMBULATORY_CARE_PROVIDER_SITE_OTHER): Payer: BC Managed Care – PPO

## 2012-01-08 DIAGNOSIS — Z0000 Encounter for general adult medical examination without abnormal findings: Secondary | ICD-10-CM

## 2012-01-08 DIAGNOSIS — Z125 Encounter for screening for malignant neoplasm of prostate: Secondary | ICD-10-CM

## 2012-01-08 DIAGNOSIS — E7800 Pure hypercholesterolemia, unspecified: Secondary | ICD-10-CM

## 2012-01-08 LAB — COMPREHENSIVE METABOLIC PANEL
ALT: 36 U/L (ref 0–53)
Alkaline Phosphatase: 49 U/L (ref 39–117)
CO2: 28 mEq/L (ref 19–32)
Creatinine, Ser: 1 mg/dL (ref 0.4–1.5)
GFR: 79.14 mL/min (ref >60.00)
Sodium: 138 mEq/L (ref 135–145)
Total Bilirubin: 0.9 mg/dL (ref 0.3–1.2)
Total Protein: 7.3 g/dL (ref 6.0–8.3)

## 2012-01-08 LAB — CBC WITH DIFFERENTIAL/PLATELET
Basophils Absolute: 0 10*3/uL (ref 0.0–0.1)
HCT: 42.9 % (ref 39.0–52.0)
Hemoglobin: 14.5 g/dL (ref 13.0–17.0)
Lymphs Abs: 2.2 10*3/uL (ref 0.7–4.0)
MCHC: 33.8 g/dL (ref 30.0–36.0)
MCV: 93.2 fl (ref 78.0–100.0)
Monocytes Absolute: 0.8 10*3/uL (ref 0.1–1.0)
Monocytes Relative: 7.9 % (ref 3.0–12.0)
Neutro Abs: 6.7 10*3/uL (ref 1.4–7.7)
Platelets: 259 10*3/uL (ref 150.0–400.0)
RDW: 12.6 % (ref 11.5–14.6)

## 2012-01-08 LAB — LIPID PANEL
Cholesterol: 168 mg/dL (ref 0–200)
HDL: 50.2 mg/dL (ref >39.00)
LDL Cholesterol: 88 mg/dL (ref 0–99)
VLDL: 30.2 mg/dL (ref 0.0–40.0)

## 2012-01-08 LAB — PSA: PSA: 1.74 ng/mL (ref 0.10–4.00)

## 2012-01-13 ENCOUNTER — Other Ambulatory Visit: Payer: BC Managed Care – PPO

## 2012-01-19 ENCOUNTER — Encounter: Payer: Self-pay | Admitting: Family Medicine

## 2012-01-19 ENCOUNTER — Ambulatory Visit (INDEPENDENT_AMBULATORY_CARE_PROVIDER_SITE_OTHER): Payer: BC Managed Care – PPO | Admitting: Family Medicine

## 2012-01-19 VITALS — BP 132/76 | HR 73 | Temp 98.3°F | Ht 70.75 in | Wt 238.8 lb

## 2012-01-19 DIAGNOSIS — Z0000 Encounter for general adult medical examination without abnormal findings: Secondary | ICD-10-CM

## 2012-01-19 DIAGNOSIS — E7800 Pure hypercholesterolemia, unspecified: Secondary | ICD-10-CM

## 2012-01-19 DIAGNOSIS — Z125 Encounter for screening for malignant neoplasm of prostate: Secondary | ICD-10-CM

## 2012-01-19 DIAGNOSIS — I10 Essential (primary) hypertension: Secondary | ICD-10-CM

## 2012-01-19 MED ORDER — METOPROLOL SUCCINATE ER 25 MG PO TB24: 25.0000 mg | ORAL_TABLET | Freq: Every day | ORAL | Status: DC

## 2012-01-19 NOTE — Patient Instructions (Addendum)
Keep working on [employer] and exercise  You are up to date on health maintenance Labs are ok  Use compound W on wart on foot- if no improvement- follow up so we can treat it

## 2012-01-19 NOTE — Assessment & Plan Note (Signed)
Reviewed health habits including diet and exercise and skin cancer prevention Also reviewed health mt list, fam hx and immunizations  Reviewed wellness labs in detail psa stable, no symptoms  DRE not done this time

## 2012-01-19 NOTE — Assessment & Plan Note (Signed)
Improved with  crestor and diet  Watched by cardiol- hx of CAD Rev labs  Rev low sat fat diet- will get better with less travel

## 2012-01-19 NOTE — Assessment & Plan Note (Signed)
psa stable Disc pros/cons of prostate cancer screening  Remote fam hx  No symptoms  DRE not done today- will do this intermittently

## 2012-01-19 NOTE — Assessment & Plan Note (Signed)
bp in fair control at this time  No changes needed  Disc lifstyle change with low sodium diet and exercise   Rev labs today 

## 2012-01-19 NOTE — Progress Notes (Signed)
Subjective:    Patient ID: Richard Baldwin, male    DOB: [birth year], 56 y.o.   MRN: [id number]  HPI Here for health maintenance exam and to review chronic medical problems    Is doing well  Just bought a new home in [place]- really happy with that  Working a lot    Flu vaccine had that at [employer] [facility] was 1/10 with a 10 year recall Bowel habits no changes or problems ,occ hemorrhoids   Prostate ca screen  No personal hx of prostate problems  fam hx - P uncles have had , but not father  No nocturia unless he drinks fluids before bed  No changes in urine stream  Lab Results  Component Value Date   PSA 1.74 01/08/2012   PSA 1.95 01/05/2011   PSA 1.69 08/19/2009    Hx of CAD- had to see Dr Jens Som recently for cp- turned out to be ok  Being watched closely  Has stents  Hyperlipidemia on crestor and diet Lab Results  Component Value Date   CHOL 168 01/08/2012   CHOL 158 12/10/2011   CHOL 171 01/05/2011   Lab Results  Component Value Date   HDL 50.20 01/08/2012   HDL 44.30 12/10/2011   HDL 47.50 01/05/2011   Lab Results  Component Value Date   LDLCALC 88 01/08/2012   LDLCALC 92 12/10/2011   LDLCALC 99 01/05/2011   Lab Results  Component Value Date   TRIG 151.0* 01/08/2012   TRIG 109.0 12/10/2011   TRIG 124.0 01/05/2011   Lab Results  Component Value Date   CHOLHDL 3 01/08/2012   CHOLHDL 4 12/10/2011   CHOLHDL 4 01/05/2011   Lab Results  Component Value Date   LDLDIRECT 124.5 08/19/2009   LDLDIRECT 105.3 03/04/2009   LDLDIRECT 140.1 08/15/2008    bp is stable today  No cp or palpitations or headaches or edema  No side effects to medicines  BP Readings from Last 3 Encounters:  01/19/12 132/76  12/02/11 151/88  11/16/11 148/90     Wt is stable with bmi of 33  Diet-not great the past 2-3 weeks/ has been gone  Exercise - is working out at [employer] and [employer] - when he is in town Also dealing with plantar fasciitis    Mood-a  lot of good changes in life right now  However will be loosing his job after the first of the year -not enough projects to hit his goals  He is pursuing a job with [employer]  Has been through several sets of interviews - making the best of it  Is in a position to retire - is secure with that and looking forward to it   Patient Active Problem List  Diagnosis  . HYPERCHOLESTEROLEMIA  . HYPERTENSION  . HEMORRHOIDS, WITH BLEEDING  . GERD  . COUGH  . TOBACCO USE, QUIT  . CHEST PAIN  . ABNORMAL EKG  . UNSTABLE ANGINA  . CAD (coronary artery disease)  . Routine general medical examination at a health care facility  . Prostate cancer screening   Past Medical History  Diagnosis Date  . CAD (coronary artery disease)     s/p Promus DES x 2 RCA 04/08/10;  Cath 2/12: mLAD 30%; CFX mild plaque; mRCA 99% then ulc. 80% tx'd with PCI, dRCA 30%; oPLB 90% (L-R collat); EF 55%  . Abnormal nuclear cardiac imaging test 04/07/10    EF 48%; inferior scar; mild-moderate peri-infarct ischemia  .  Hypertension   . Hyperlipidemia   . GERD (gastroesophageal reflux disease)   . Colon polyps   . Hemorrhoids    Past Surgical History  Procedure Date  . Tonsillectomy   . Colonoscopy w/ polypectomy 2010  . Left heart catheterization 04/09/10  . Ptca 04/09/10     with placement two overlapping drug-eluting stents in the mid right coronary artery.    History  Substance Use Topics  . Smoking status: Former Smoker    Types: Cigarettes    Quit date: 02/23/2005  . Smokeless tobacco: Not on file     Comment: Quit smoking 02/2005.  Marland Kitchen Alcohol Use: No   Family History  Problem Relation Age of Onset  . Heart attack Father   . Diabetes Father   . Heart disease Father     CAD  . Heart disease Other     Strong family hx of CAD  . Sudden death Cousin     ? Cardiac   Allergies  Allergen Reactions  . Atorvastatin     REACTION: elevated liver functions  . Butenafine     REACTION: burned  . Varenicline Tartrate      REACTION: sleepy   Current Outpatient Prescriptions on File Prior to Visit  Medication Sig Dispense Refill  . Ascorbic Acid (VITAMIN C) 1000 MG tablet Take 1,000 mg by mouth daily.        Marland Kitchen aspirin 81 MG tablet Take 81 mg by mouth daily.        . fish oil-omega-3 fatty acids 1000 MG capsule Take 1 g by mouth daily.        Marland Kitchen lisinopril (PRINIVIL,ZESTRIL) 10 MG tablet Take 2 tablets (20 mg total) by mouth daily.  30 tablet  11  . metoprolol succinate (TOPROL-XL) 25 MG 24 hr tablet Take 1 tablet (25 mg total) by mouth daily.  30 tablet  11  . NITROSTAT 0.4 MG SL tablet PLACE 1 TABLET UNDER TONGUE EVERY 5 MINS FOR 3 DOSES AS NEEDED  25 tablet  2  . pantoprazole (PROTONIX) 40 MG tablet Take 1 tablet (40 mg total) by mouth daily.  30 tablet  11  . rosuvastatin (CRESTOR) 20 MG tablet Take 1 tablet (20 mg total) by mouth daily.  30 tablet  11          Review of Systems Review of Systems  Constitutional: Negative for fever, appetite change, fatigue and unexpected weight change.  Eyes: Negative for pain and visual disturbance.  Respiratory: Negative for cough and shortness of breath.   Cardiovascular: Negative for cp or palpitations    Gastrointestinal: Negative for nausea, diarrhea and constipation.  Genitourinary: Negative for urgency and frequency.  Skin: Negative for pallor or rash   Neurological: Negative for weakness, light-headedness, numbness and headaches.  Hematological: Negative for adenopathy. Does not bruise/bleed easily.  Psychiatric/Behavioral: Negative for dysphoric mood. The patient is not nervous/anxious.         Objective:   Physical Exam  Constitutional: He appears well-developed and well-nourished. No distress.       obese and well appearing   HENT:  Head: Normocephalic and atraumatic.  Right Ear: External ear normal.  Left Ear: External ear normal.  Nose: Nose normal.  Mouth/Throat: Oropharynx is clear and moist.  Eyes: Conjunctivae normal and EOM are  normal. Pupils are equal, round, and reactive to light.  Neck: Normal range of motion. Neck supple. No JVD present. Carotid bruit is not present. No thyromegaly present.  Cardiovascular: Normal rate,  regular rhythm, normal heart sounds and intact distal pulses.  Exam reveals no gallop.   Pulmonary/Chest: Effort normal and breath sounds normal. No respiratory distress. He has no wheezes.  Abdominal: Soft. Bowel sounds are normal. He exhibits no distension and no mass. There is no tenderness.  Musculoskeletal: Normal range of motion. He exhibits no edema and no tenderness.  Lymphadenopathy:    He has no cervical adenopathy.  Neurological: He is alert. He has normal reflexes. No cranial nerve deficit. He exhibits normal muscle tone. Coordination normal.  Skin: Skin is warm and dry. No rash noted. No erythema. No pallor.  Psychiatric: He has a normal mood and affect.          Assessment & Plan:

## 2012-01-28 ENCOUNTER — Other Ambulatory Visit: Payer: Self-pay | Admitting: Family Medicine

## 2012-02-12 ENCOUNTER — Other Ambulatory Visit: Payer: BC Managed Care – PPO

## 2012-02-12 ENCOUNTER — Other Ambulatory Visit (INDEPENDENT_AMBULATORY_CARE_PROVIDER_SITE_OTHER): Payer: BC Managed Care – PPO

## 2012-02-12 ENCOUNTER — Other Ambulatory Visit: Payer: Self-pay | Admitting: *Deleted

## 2012-02-12 DIAGNOSIS — E7800 Pure hypercholesterolemia, unspecified: Secondary | ICD-10-CM

## 2012-02-12 DIAGNOSIS — E785 Hyperlipidemia, unspecified: Secondary | ICD-10-CM

## 2012-02-12 LAB — HEPATIC FUNCTION PANEL
Alkaline Phosphatase: 46 U/L (ref 39–117)
Bilirubin, Direct: 0.1 mg/dL (ref 0.0–0.3)
Total Bilirubin: 0.8 mg/dL (ref 0.3–1.2)

## 2012-02-12 LAB — LIPID PANEL
HDL: 44.5 mg/dL (ref >39.00)
VLDL: 41.2 mg/dL — ABNORMAL HIGH (ref 0.0–40.0)

## 2012-02-12 MED ORDER — ROSUVASTATIN CALCIUM 40 MG PO TABS: 40.0000 mg | ORAL_TABLET | Freq: Every day | ORAL | Status: DC

## 2012-02-24 HISTORY — PX: FLEXIBLE SIGMOIDOSCOPY: SHX1649

## 2012-03-03 ENCOUNTER — Ambulatory Visit (INDEPENDENT_AMBULATORY_CARE_PROVIDER_SITE_OTHER): Payer: BC Managed Care – PPO | Admitting: Cardiology

## 2012-03-03 ENCOUNTER — Encounter: Payer: Self-pay | Admitting: Cardiology

## 2012-03-03 VITALS — BP 140/86 | HR 89 | Ht 72.0 in | Wt 240.0 lb

## 2012-03-03 DIAGNOSIS — I10 Essential (primary) hypertension: Secondary | ICD-10-CM

## 2012-03-03 DIAGNOSIS — I2510 Atherosclerotic heart disease of native coronary artery without angina pectoris: Secondary | ICD-10-CM

## 2012-03-03 DIAGNOSIS — E7800 Pure hypercholesterolemia, unspecified: Secondary | ICD-10-CM

## 2012-03-03 NOTE — Patient Instructions (Addendum)
Your physician wants you to follow-up in: 6 MONTHS WITH DR CRENSHAW You will receive a reminder letter in the mail two months in advance. If you don't receive a letter, please call our office to schedule the follow-up appointment.  

## 2012-03-03 NOTE — Assessment & Plan Note (Signed)
Blood pressure controlled. Continue present medications. 

## 2012-03-03 NOTE — Assessment & Plan Note (Signed)
Continue aspirin and statin. 

## 2012-03-03 NOTE — Progress Notes (Signed)
HPI: Pleasant male who was admitted from the office on 04/07/10 with unstable angina. He had Myoview study that day that was abnormal with inferior scar and mild to moderate peri-Infarct ischemia. EF was 48%. Cardiac catheterization demonstrated 30% LAD stenosis and mild plaque in the circumflex. He had a mid 99% and then an ulcerated 80% stenosis in the RCA. This was treated with 2 drug-eluting stents (Promus). He also had a 90% ostial posterior lateral branch stenosis. There are left to right collaterals. EF 55%. I last saw him in Oct of 2013. Since he was last seen, there is no dyspnea on exertion, orthopnea, PND or pedal edema. There is no exertional chest pain.    Current Outpatient Prescriptions  Medication Sig Dispense Refill  . Ascorbic Acid (VITAMIN C) 1000 MG tablet Take 1,000 mg by mouth daily.        Marland Kitchen aspirin 81 MG tablet Take 81 mg by mouth daily.        . fish oil-omega-3 fatty acids 1000 MG capsule Take 1 g by mouth daily.        Marland Kitchen lisinopril (PRINIVIL,ZESTRIL) 10 MG tablet Take 2 tablets (20 mg total) by mouth daily.  30 tablet  11  . metoprolol succinate (TOPROL-XL) 25 MG 24 hr tablet Take 1 tablet (25 mg total) by mouth daily.  30 tablet  11  . NITROSTAT 0.4 MG SL tablet PLACE 1 TABLET UNDER TONGUE EVERY 5 MINS FOR 3 DOSES AS NEEDED  25 tablet  2  . pantoprazole (PROTONIX) 40 MG tablet Take 1 tablet (40 mg total) by mouth daily.  30 tablet  11  . rosuvastatin (CRESTOR) 40 MG tablet Take 1 tablet (40 mg total) by mouth daily.  30 tablet  11     Past Medical History  Diagnosis Date  . CAD (coronary artery disease)     s/p Promus DES x 2 RCA 04/08/10;  Cath 2/12: mLAD 30%; CFX mild plaque; mRCA 99% then ulc. 80% tx'd with PCI, dRCA 30%; oPLB 90% (L-R collat); EF 55%  . Abnormal nuclear cardiac imaging test 04/07/10    EF 48%; inferior scar; mild-moderate peri-infarct ischemia  . Hypertension   . Hyperlipidemia   . GERD (gastroesophageal reflux disease)   . Colon polyps     . Hemorrhoids     Past Surgical History  Procedure Date  . Tonsillectomy   . Colonoscopy w/ polypectomy 2010  . Left heart catheterization 04/09/10  . Ptca 04/09/10     with placement two overlapping drug-eluting stents in the mid right coronary artery.     History   Social History  . Marital Status: Married    Spouse Name: N/A    Number of Children: N/A  . Years of Education: N/A   Occupational History  . Not on file.   Social History Main Topics  . Smoking status: Former Smoker    Types: Cigarettes    Quit date: 02/23/2005  . Smokeless tobacco: Not on file     Comment: Quit smoking 02/2005.  Marland Kitchen Alcohol Use: No  . Drug Use: No  . Sexually Active: Not on file   Other Topics Concern  . Not on file   Social History Narrative   Regular exercise at gym.    ROS: no fevers or chills, productive cough, hemoptysis, dysphasia, odynophagia, melena, hematochezia, dysuria, hematuria, rash, seizure activity, orthopnea, PND, pedal edema, claudication. Remaining systems are negative.  Physical Exam: Well-developed well-nourished in no acute distress.  Skin is  warm and dry.  HEENT is normal.  Neck is supple.  Chest is clear to auscultation with normal expansion.  Cardiovascular exam is regular rate and rhythm.  Abdominal exam nontender or distended. No masses palpated. Extremities show no edema. neuro grossly intact

## 2012-03-03 NOTE — Assessment & Plan Note (Signed)
Crestor recently increased. Check lipids and liver in 2-4 weeks.

## 2012-03-04 ENCOUNTER — Telehealth: Payer: Self-pay

## 2012-03-04 NOTE — Telephone Encounter (Signed)
[facility] faxed [facility] Crestor; completed form faxed in; approval letter faxed to [facility] and placed on Dr Royden Purl shelf for signing and scanning.

## 2012-04-14 ENCOUNTER — Other Ambulatory Visit (INDEPENDENT_AMBULATORY_CARE_PROVIDER_SITE_OTHER): Payer: BC Managed Care – PPO

## 2012-04-14 DIAGNOSIS — E7800 Pure hypercholesterolemia, unspecified: Secondary | ICD-10-CM

## 2012-04-14 LAB — HEPATIC FUNCTION PANEL
ALT: 33 U/L (ref 0–53)
AST: 26 U/L (ref 0–37)
Albumin: 4.3 g/dL (ref 3.5–5.2)
Alkaline Phosphatase: 42 U/L (ref 39–117)
Total Protein: 6.9 g/dL (ref 6.0–8.3)

## 2012-04-14 LAB — LIPID PANEL
Cholesterol: 136 mg/dL (ref 0–200)
HDL: 46.4 mg/dL (ref >39.00)
Triglycerides: 103 mg/dL (ref 0.0–149.0)

## 2012-04-15 ENCOUNTER — Encounter: Payer: Self-pay | Admitting: *Deleted

## 2012-05-07 ENCOUNTER — Other Ambulatory Visit: Payer: Self-pay | Admitting: Cardiology

## 2012-05-15 ENCOUNTER — Other Ambulatory Visit: Payer: Self-pay | Admitting: Cardiology

## 2012-05-16 ENCOUNTER — Telehealth: Payer: Self-pay | Admitting: Cardiology

## 2012-05-16 NOTE — Telephone Encounter (Signed)
New Prob   Has question about Lisinopril. Needs clarification on directions. Would like to speak to nurse.

## 2012-05-16 NOTE — Telephone Encounter (Signed)
Prescription clarified with pharmacy. Pt is to take Lisinopril 10 mg 2 tablets once a day.

## 2012-07-15 ENCOUNTER — Ambulatory Visit (INDEPENDENT_AMBULATORY_CARE_PROVIDER_SITE_OTHER): Payer: BC Managed Care – PPO | Admitting: Family Medicine

## 2012-07-15 ENCOUNTER — Encounter: Payer: Self-pay | Admitting: Family Medicine

## 2012-07-15 VITALS — BP 136/88 | HR 80 | Temp 97.7°F | Ht 70.75 in | Wt 236.8 lb

## 2012-07-15 DIAGNOSIS — R197 Diarrhea, unspecified: Secondary | ICD-10-CM | POA: Insufficient documentation

## 2012-07-15 MED ORDER — METRONIDAZOLE 500 MG PO TABS: 500.0000 mg | ORAL_TABLET | Freq: Three times a day (TID) | ORAL | Status: DC

## 2012-07-15 NOTE — Progress Notes (Signed)
Subjective:    Patient ID: Richard Baldwin, male    DOB: 12/13/1955, 57 y.o.   MRN: [id number]  HPI Here with diarrhea   Had a root canal 2 weeks ago and then had pain afterward- dentist put him on 300 mg - every 6 hours  Had diarrhea right after starting it and this continued (has continued)  Last dose of clindamycin - was Sunday am  Still having diarrhea  No blood in stool , watery stool  Not able to eat well at all - with urgency of stool  A lot of abd pressure and some cramps No fever No n/v    Patient Active Problem List   Diagnosis Date Noted  . Routine general medical examination at a health care facility 12/08/2010  . Prostate cancer screening 12/08/2010  . CAD (coronary artery disease)   . UNSTABLE ANGINA 04/07/2010  . ABNORMAL EKG 03/04/2010  . CHEST PAIN 02/28/2010  . COUGH 01/27/2010  . HYPERCHOLESTEROLEMIA 05/28/2006  . HYPERTENSION 05/28/2006  . HEMORRHOIDS, WITH BLEEDING 05/28/2006  . GERD 05/28/2006  . TOBACCO USE, QUIT 05/28/2006   Past Medical History  Diagnosis Date  . CAD (coronary artery disease)     s/p Promus DES x 2 RCA 04/08/10;  Cath 2/12: mLAD 30%; CFX mild plaque; mRCA 99% then ulc. 80% tx'd with PCI, dRCA 30%; oPLB 90% (L-R collat); EF 55%  . Abnormal nuclear cardiac imaging test 04/07/10    EF 48%; inferior scar; mild-moderate peri-infarct ischemia  . Hypertension   . Hyperlipidemia   . GERD (gastroesophageal reflux disease)   . Colon polyps   . Hemorrhoids    Past Surgical History  Procedure Laterality Date  . Tonsillectomy    . Colonoscopy w/ polypectomy  2010  . Left heart catheterization  04/09/10  . Ptca  04/09/10     with placement two overlapping drug-eluting stents in the mid right coronary artery.    History  Substance Use Topics  . Smoking status: Former Smoker    Types: Cigarettes    Quit date: 02/23/2005  . Smokeless tobacco: Not on file     Comment: Quit smoking 02/2005.  Marland Kitchen Alcohol Use: No   Family History   Problem Relation Age of Onset  . Heart attack Father   . Diabetes Father   . Heart disease Father     CAD  . Heart disease Other     Strong family hx of CAD  . Sudden death Cousin     ? Cardiac   Allergies  Allergen Reactions  . Atorvastatin     REACTION: elevated liver functions  . Butenafine     REACTION: burned  . Varenicline Tartrate     REACTION: sleepy   Current Outpatient Prescriptions on File Prior to Visit  Medication Sig Dispense Refill  . Ascorbic Acid (VITAMIN C) 1000 MG tablet Take 1,000 mg by mouth daily.        Marland Kitchen aspirin 81 MG tablet Take 81 mg by mouth daily.        . fish oil-omega-3 fatty acids 1000 MG capsule Take 1 g by mouth daily.        Marland Kitchen lisinopril (PRINIVIL,ZESTRIL) 10 MG tablet TAKE 1 TABLET BY MOUTH ONCE DAILY  60 tablet  5  . metoprolol succinate (TOPROL-XL) 25 MG 24 hr tablet TAKE 1 TABLET (25 MG TOTAL) BY MOUTH DAILY.  30 tablet  11  . NITROSTAT 0.4 MG SL tablet PLACE 1 TABLET UNDER TONGUE EVERY 5 MINS  FOR 3 DOSES AS NEEDED  25 tablet  2  . pantoprazole (PROTONIX) 40 MG tablet TAKE 1 TABLET (40 MG TOTAL) BY MOUTH DAILY.  30 tablet  11  . rosuvastatin (CRESTOR) 40 MG tablet Take 1 tablet (40 mg total) by mouth daily.  30 tablet  11   No current facility-administered medications on file prior to visit.    Review of Systems Review of Systems  Constitutional: Negative for fever, appetite change, fatigue and unexpected weight change.  Eyes: Negative for pain and visual disturbance.  Respiratory: Negative for cough and shortness of breath.   Cardiovascular: Negative for cp or palpitations    Gastrointestinal: Negative for nausea, vomiting, blood in stool or dark stool and heartburn  Genitourinary: Negative for urgency and frequency.  Skin: Negative for pallor or rash   Neurological: Negative for weakness, light-headedness, numbness and headaches.  Hematological: Negative for adenopathy. Does not bruise/bleed easily.  Psychiatric/Behavioral:  Negative for dysphoric mood. The patient is not nervous/anxious.         Objective:   Physical Exam  Constitutional: He appears well-developed and well-nourished. No distress.  obese and well appearing   HENT:  Head: Normocephalic and atraumatic.  Mouth/Throat: Oropharynx is clear and moist.  Eyes: Conjunctivae and EOM are normal. Pupils are equal, round, and reactive to light. Right eye exhibits no discharge. Left eye exhibits no discharge. No scleral icterus.  Neck: Normal range of motion. Neck supple. No JVD present. No thyromegaly present.  Cardiovascular: Normal rate and regular rhythm.   Pulmonary/Chest: Effort normal and breath sounds normal.  Abdominal: Soft. He exhibits no distension and no mass. There is tenderness. There is no rebound and no guarding.  Mild lower quad tenderness without rebound or guarding  Generally hyperactive bs   Musculoskeletal: He exhibits no edema.  Lymphadenopathy:    He has no cervical adenopathy.  Neurological: He is alert. He has normal reflexes.  Skin: Skin is warm and dry.  Nl color and turgor  Psychiatric: He has a normal mood and affect.          Assessment & Plan:

## 2012-07-15 NOTE — Patient Instructions (Addendum)
Go to the lab to get stool collection equipment  As soon as you can give a specimen - then start the flagyl (sent this to your pharmacy)  If symptoms worsen let me know

## 2012-07-15 NOTE — Assessment & Plan Note (Signed)
In pt prev on clinda- suspect c diff  Will get stool sample for testing and then begin course of tid flagyl  Update if not imp or if worse Disc s/s of dehydration to watch for

## 2012-07-16 LAB — CLOSTRIDIUM DIFFICILE EIA: CDIFTX: NEGATIVE

## 2012-07-29 ENCOUNTER — Ambulatory Visit (INDEPENDENT_AMBULATORY_CARE_PROVIDER_SITE_OTHER): Payer: BC Managed Care – PPO | Admitting: Family Medicine

## 2012-07-29 ENCOUNTER — Encounter: Payer: Self-pay | Admitting: Family Medicine

## 2012-07-29 ENCOUNTER — Ambulatory Visit: Payer: BC Managed Care – PPO | Admitting: Family Medicine

## 2012-07-29 VITALS — BP 132/88 | HR 75 | Temp 97.4°F | Ht 70.75 in | Wt 238.5 lb

## 2012-07-29 DIAGNOSIS — R140 Abdominal distension (gaseous): Secondary | ICD-10-CM | POA: Insufficient documentation

## 2012-07-29 DIAGNOSIS — R197 Diarrhea, unspecified: Secondary | ICD-10-CM

## 2012-07-29 DIAGNOSIS — R141 Gas pain: Secondary | ICD-10-CM

## 2012-07-29 LAB — BASIC METABOLIC PANEL
CO2: 28 mEq/L (ref 19–32)
Chloride: 101 mEq/L (ref 96–112)
Creat: 0.98 mg/dL (ref 0.50–1.35)
Potassium: 4.4 mEq/L (ref 3.5–5.3)
Sodium: 136 mEq/L (ref 135–145)

## 2012-07-29 LAB — CBC WITH DIFFERENTIAL/PLATELET
Basophils Absolute: 0.1 10*3/uL (ref 0.0–0.1)
Eosinophils Relative: 2 % (ref 0–5)
Lymphocytes Relative: 22 % (ref 12–46)
Lymphs Abs: 2.2 10*3/uL (ref 0.7–4.0)
MCV: 89.8 fL (ref 78.0–100.0)
Neutro Abs: 6.5 10*3/uL (ref 1.7–7.7)
Neutrophils Relative %: 65 % (ref 43–77)
Platelets: 274 10*3/uL (ref 150–400)
RBC: 4.7 MIL/uL (ref 4.22–5.81)
RDW: 13.5 % (ref 11.5–15.5)
WBC: 10 10*3/uL (ref 4.0–10.5)

## 2012-07-29 LAB — HEPATIC FUNCTION PANEL
ALT: 49 U/L (ref 0–53)
AST: 33 U/L (ref 0–37)
Albumin: 4.4 g/dL (ref 3.5–5.2)
Bilirubin, Direct: 0.2 mg/dL (ref 0.0–0.3)
Total Bilirubin: 0.7 mg/dL (ref 0.3–1.2)

## 2012-07-29 NOTE — Patient Instructions (Addendum)
Labs today  Try to keep diet somewhat bland - and avoid spicy foods and do not eat a lot of dairy  Get align over the counter - and take it as directed over the weekend  Keep drinking water and fluids  Avoid carbonated beverages

## 2012-07-29 NOTE — Progress Notes (Signed)
Subjective:    Patient ID: Richard Baldwin, male    DOB: [birth year], 57 y.o.   MRN: [id number]  HPI Here for f/u of diarrhea  Last visit had developed it after taking clindamycin for root canal  tx empirically for c diff with flagly c diff test was negative suprisingly  He initially got better  Grilled pork chops and veg Sunday and worked in the yard  Monday- diarrhea  Had to travel- ate salads and chicken  No n/v  Overall is eating pretty bland  Lot of rumbling in his stomach , some gas (improved a bit)   Low abdomen is very bloated and swollen - very uncomfortable  No blood in his stool  No dark stool  No meds otc   He really does not think this is food poisoning  Has not been out of the country   No one has been sick with similar symptoms  Diarrhea - Tuesday- liquid stools , since then 1-2 bm per day - softer than normal but more formed   Fiber - has backed off - was making him worse   No fever  No chills or body aches Is stressed and tired    colonosc 2010 Non adenomatous polyp and some tics   Wt is up 2 lb with bmi of 33      Patient Active Problem List   Diagnosis Date Noted  . Diarrhea 07/15/2012  . Routine general medical examination at a health care facility 12/08/2010  . Prostate cancer screening 12/08/2010  . CAD (coronary artery disease)   . UNSTABLE ANGINA 04/07/2010  . ABNORMAL EKG 03/04/2010  . CHEST PAIN 02/28/2010  . COUGH 01/27/2010  . HYPERCHOLESTEROLEMIA 05/28/2006  . HYPERTENSION 05/28/2006  . HEMORRHOIDS, WITH BLEEDING 05/28/2006  . GERD 05/28/2006  . TOBACCO USE, QUIT 05/28/2006   Past Medical History  Diagnosis Date  . CAD (coronary artery disease)     s/p Promus DES x 2 RCA 04/08/10;  Cath 2/12: mLAD 30%; CFX mild plaque; mRCA 99% then ulc. 80% tx'd with PCI, dRCA 30%; oPLB 90% (L-R collat); EF 55%  . Abnormal nuclear cardiac imaging test 04/07/10    EF 48%; inferior scar; mild-moderate peri-infarct ischemia  .  Hypertension   . Hyperlipidemia   . GERD (gastroesophageal reflux disease)   . Colon polyps   . Hemorrhoids    Past Surgical History  Procedure Laterality Date  . Tonsillectomy    . Colonoscopy w/ polypectomy  2010  . Left heart catheterization  04/09/10  . Ptca  04/09/10     with placement two overlapping drug-eluting stents in the mid right coronary artery.    History  Substance Use Topics  . Smoking status: Former Smoker    Types: Cigarettes    Quit date: 02/23/2005  . Smokeless tobacco: Not on file     Comment: Quit smoking 02/2005.  Marland Kitchen Alcohol Use: No   Family History  Problem Relation Age of Onset  . Heart attack Father   . Diabetes Father   . Heart disease Father     CAD  . Heart disease Other     Strong family hx of CAD  . Sudden death Cousin     ? Cardiac   Allergies  Allergen Reactions  . Atorvastatin     REACTION: elevated liver functions  . Butenafine     REACTION: burned  . Varenicline Tartrate     REACTION: sleepy   Current Outpatient Prescriptions on File Prior to  Visit  Medication Sig Dispense Refill  . Ascorbic Acid (VITAMIN C) 1000 MG tablet Take 1,000 mg by mouth daily.        Marland Kitchen aspirin 81 MG tablet Take 81 mg by mouth daily.        . fish oil-omega-3 fatty acids 1000 MG capsule Take 1 g by mouth daily.        Marland Kitchen lisinopril (PRINIVIL,ZESTRIL) 10 MG tablet TAKE 1 TABLET BY MOUTH ONCE DAILY  60 tablet  5  . metoprolol succinate (TOPROL-XL) 25 MG 24 hr tablet TAKE 1 TABLET (25 MG TOTAL) BY MOUTH DAILY.  30 tablet  11  . NITROSTAT 0.4 MG SL tablet PLACE 1 TABLET UNDER TONGUE EVERY 5 MINS FOR 3 DOSES AS NEEDED  25 tablet  2  . pantoprazole (PROTONIX) 40 MG tablet TAKE 1 TABLET (40 MG TOTAL) BY MOUTH DAILY.  30 tablet  11  . rosuvastatin (CRESTOR) 40 MG tablet Take 1 tablet (40 mg total) by mouth daily.  30 tablet  11   No current facility-administered medications on file prior to visit.    Review of Systems Review of Systems  Constitutional: Negative  for fever, appetite change, fatigue and unexpected weight change.  Eyes: Negative for pain and visual disturbance.  Respiratory: Negative for cough and shortness of breath.   Cardiovascular: Negative for cp or palpitations    Gastrointestinal: Negative for constipation/ blood in stool/n/v Genitourinary: Negative for urgency and frequency.  Skin: Negative for pallor or rash   Neurological: Negative for weakness, light-headedness, numbness and headaches.  Hematological: Negative for adenopathy. Does not bruise/bleed easily.  Psychiatric/Behavioral: Negative for dysphoric mood. The patient is not nervous/anxious.         Objective:   Physical Exam  Constitutional: He appears well-developed and well-nourished. No distress.  HENT:  Head: Normocephalic and atraumatic.  Eyes: Conjunctivae and EOM are normal. Pupils are equal, round, and reactive to light. No scleral icterus.  Neck: Normal range of motion. Neck supple.  Cardiovascular: Normal rate and regular rhythm.   Pulmonary/Chest: Effort normal and breath sounds normal. No respiratory distress. He has no wheezes. He has no rales.  Abdominal: Soft. Bowel sounds are normal. He exhibits no distension and no mass. There is no tenderness. There is no rebound and no guarding.  Bloated but not distended Non tender    Musculoskeletal: He exhibits no edema.  Lymphadenopathy:    He has no cervical adenopathy.  Neurological: He is alert. He has normal reflexes.  Skin: Skin is warm and dry. No rash noted. No erythema. No pallor.  No jaundice  Psychiatric: He has a normal mood and affect.          Assessment & Plan:

## 2012-08-29 ENCOUNTER — Encounter: Payer: Self-pay | Admitting: Family Medicine

## 2012-08-29 ENCOUNTER — Ambulatory Visit (INDEPENDENT_AMBULATORY_CARE_PROVIDER_SITE_OTHER): Payer: BC Managed Care – PPO | Admitting: Family Medicine

## 2012-08-29 ENCOUNTER — Encounter: Payer: Self-pay | Admitting: Gastroenterology

## 2012-08-29 VITALS — BP 132/80 | HR 75 | Temp 97.6°F | Ht 70.75 in | Wt 238.8 lb

## 2012-08-29 DIAGNOSIS — R141 Gas pain: Secondary | ICD-10-CM

## 2012-08-29 DIAGNOSIS — R140 Abdominal distension (gaseous): Secondary | ICD-10-CM

## 2012-08-29 DIAGNOSIS — K921 Melena: Secondary | ICD-10-CM

## 2012-08-29 DIAGNOSIS — R197 Diarrhea, unspecified: Secondary | ICD-10-CM

## 2012-08-29 NOTE — Patient Instructions (Addendum)
Stop up front for GI referral on the way out  Keep eating a healthy diet - avoid dairy or excess fiber if it bothers you  Keep drinking lots of fluids

## 2012-08-29 NOTE — Assessment & Plan Note (Signed)
Ongoing and intermittent -worse when traveling in pt with last colonosc 2010 Now blood in stool and bloating are worrisome  Poss IBS but need to r/o IBD or other process / perhaps gluten issue Ref to GI

## 2012-08-29 NOTE — Progress Notes (Signed)
Subjective:    Patient ID: Richard Baldwin, male    DOB: 17-Mar-1955, 57 y.o.   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 - and it has been hard on him- is eating chicken with salads and cereal with bkfast (thinks fiber may make him worse )  Then started getting a little worse- and much worse from Tuesday on   Loose stool and frequent stool and blood in stool now times one (dark in color- he does have hemorrhoids)  - had bm after each meal about 2 hours  Does not awaken at night however with need for BM Stomach feels like a "gurgling machine" Caffeine- about 1-2 cups per day , (not different from usual)  No more alcohol when he travels - in fact does not drink much at all esp when he does not feel well   No fam hx of GI disorders     Had a colonosc 2010  Last labs were ok   Chemistry      Component Value Date/Time   NA 136 07/29/2012 1644   K 4.4 07/29/2012 1644   CL 101 07/29/2012 1644   CO2 28 07/29/2012 1644   BUN 24* 07/29/2012 1644   CREATININE 0.98 07/29/2012 1644   CREATININE 1.0 01/08/2012 0852      Component Value Date/Time   CALCIUM 9.9 07/29/2012 1644   ALKPHOS 49 07/29/2012 1644   AST 33 07/29/2012 1644   ALT 49 07/29/2012 1644   BILITOT 0.7 07/29/2012 1644     Lab Results  Component Value Date   WBC 10.0 07/29/2012   HGB 14.3 07/29/2012   HCT 42.2 07/29/2012   MCV 89.8 07/29/2012   PLT 274 07/29/2012     Patient Active Problem List   Diagnosis Date Noted  . Abdominal bloating 07/29/2012  . Diarrhea 07/15/2012  . Routine general medical examination at a health care facility 12/08/2010  . Prostate cancer screening 12/08/2010  . CAD (coronary artery disease)   . UNSTABLE ANGINA 04/07/2010  . ABNORMAL EKG 03/04/2010  . CHEST PAIN 02/28/2010  . COUGH 01/27/2010  . HYPERCHOLESTEROLEMIA 05/28/2006  .  HYPERTENSION 05/28/2006  . HEMORRHOIDS, WITH BLEEDING 05/28/2006  . GERD 05/28/2006  . TOBACCO USE, QUIT 05/28/2006   Past Medical History  Diagnosis Date  . CAD (coronary artery disease)     s/p Promus DES x 2 RCA 04/08/10;  Cath 2/12: mLAD 30%; CFX mild plaque; mRCA 99% then ulc. 80% tx'd with PCI, dRCA 30%; oPLB 90% (L-R collat); EF 55%  . Abnormal nuclear cardiac imaging test 04/07/10    EF 48%; inferior scar; mild-moderate peri-infarct ischemia  . Hypertension   . Hyperlipidemia   . GERD (gastroesophageal reflux disease)   . Colon polyps   . Hemorrhoids    Past Surgical History  Procedure Laterality Date  . Tonsillectomy    . Colonoscopy w/ polypectomy  2010  . Left heart catheterization  04/09/10  . Ptca  04/09/10     with placement two overlapping drug-eluting stents in the mid right coronary artery.    History  Substance Use Topics  . Smoking status: Former Smoker    Types: Cigarettes    Quit date: 02/23/2005  . Smokeless tobacco: Not on file     Comment: Quit smoking 02/2005.  Marland Kitchen  Alcohol Use: No   Family History  Problem Relation Age of Onset  . Heart attack Father   . Diabetes Father   . Heart disease Father     CAD  . Heart disease Other     Strong family hx of CAD  . Sudden death Cousin     ? Cardiac   Allergies  Allergen Reactions  . Atorvastatin     REACTION: elevated liver functions  . Butenafine     REACTION: burned  . Varenicline Tartrate     REACTION: sleepy   Current Outpatient Prescriptions on File Prior to Visit  Medication Sig Dispense Refill  . Ascorbic Acid (VITAMIN C) 1000 MG tablet Take 1,000 mg by mouth daily.        Marland Kitchen aspirin 81 MG tablet Take 81 mg by mouth daily.        . fish oil-omega-3 fatty acids 1000 MG capsule Take 1 g by mouth daily.        Marland Kitchen lisinopril (PRINIVIL,ZESTRIL) 10 MG tablet TAKE 1 TABLET BY MOUTH ONCE DAILY  60 tablet  5  . metoprolol succinate (TOPROL-XL) 25 MG 24 hr tablet TAKE 1 TABLET (25 MG TOTAL) BY MOUTH  DAILY.  30 tablet  11  . NITROSTAT 0.4 MG SL tablet PLACE 1 TABLET UNDER TONGUE EVERY 5 MINS FOR 3 DOSES AS NEEDED  25 tablet  2  . pantoprazole (PROTONIX) 40 MG tablet TAKE 1 TABLET (40 MG TOTAL) BY MOUTH DAILY.  30 tablet  11  . rosuvastatin (CRESTOR) 40 MG tablet Take 1 tablet (40 mg total) by mouth daily.  30 tablet  11   No current facility-administered medications on file prior to visit.    Review of Systems Review of Systems  Constitutional: Negative for fever, appetite change, fatigue and unexpected weight change.  Eyes: Negative for pain and visual disturbance.  Respiratory: Negative for cough and shortness of breath.   Cardiovascular: Negative for cp or palpitations    Gastrointestinal: Negative for nausea and constipation and abd pain / pos for loose stool with urgency and bloating   Genitourinary: Negative for urgency and frequency.  Skin: Negative for pallor or rash   Neurological: Negative for weakness, light-headedness, numbness and headaches.  Hematological: Negative for adenopathy. Does not bruise/bleed easily.  Psychiatric/Behavioral: Negative for dysphoric mood. The patient is not nervous/anxious.         Objective:   Physical Exam  Constitutional: He appears well-developed and well-nourished. No distress.  obese and well appearing   HENT:  Head: Normocephalic and atraumatic.  Eyes: Conjunctivae and EOM are normal. Pupils are equal, round, and reactive to light. No scleral icterus.  Neck: Normal range of motion. Neck supple. No JVD present. No thyromegaly present.  Cardiovascular: Normal rate and regular rhythm.   Pulmonary/Chest: Effort normal and breath sounds normal. He has no wheezes.  Abdominal: Soft. He exhibits no distension and no mass. There is no hepatosplenomegaly. There is no tenderness. There is no rebound and no guarding.  bs are mildly hyperactive and not high pitched   Lymphadenopathy:    He has no cervical adenopathy.  Neurological: He is alert.  He has normal reflexes.  Skin: Skin is warm and dry. No rash noted. No erythema. No pallor.  No jaundice   Psychiatric: He has a normal mood and affect.          Assessment & Plan:

## 2012-08-31 ENCOUNTER — Encounter: Payer: Self-pay | Admitting: Gastroenterology

## 2012-08-31 ENCOUNTER — Ambulatory Visit (INDEPENDENT_AMBULATORY_CARE_PROVIDER_SITE_OTHER): Payer: BC Managed Care – PPO | Admitting: Gastroenterology

## 2012-08-31 VITALS — BP 110/80 | HR 64 | Ht 70.75 in | Wt 238.6 lb

## 2012-08-31 DIAGNOSIS — R197 Diarrhea, unspecified: Secondary | ICD-10-CM

## 2012-08-31 NOTE — Patient Instructions (Addendum)
You have been scheduled for a flexible sigmoidoscopy. Please follow the written instructions given to you at your visit today. If you use inhalers (even only as needed), please bring them with you on the day of your procedure.  

## 2012-08-31 NOTE — Progress Notes (Signed)
History of Present Illness: 57 year old white male referred at the request of Dr. Milinda Antis for evaluation of diarrhea. Approximately 2 months ago he was placed on clindamycin for a dental procedure. He immediately developed diarrhea. Clindamycin was discontinued after several days. Since that time he has been plagued by intermittent episodes of severe diarrhea with lower abdominal pressure. Stools for C. Difficile toxin were negative in May. While he would go days or even a couple weeks with  normal stools, he would have bouts of severe diarrhea. Last week he had multiple episodes of diarrhea and, at one point, had blood mixed with the stools. He has taken probiotics without improvement. Colonoscopy in 2010 and demonstrated a non-adenomatous polyp.    Past Medical History  Diagnosis Date  . CAD (coronary artery disease)     s/p Promus DES x 2 RCA 04/08/10;  Cath 2/12: mLAD 30%; CFX mild plaque; mRCA 99% then ulc. 80% tx'd with PCI, dRCA 30%; oPLB 90% (L-R collat); EF 55%  . Abnormal nuclear cardiac imaging test 04/07/10    EF 48%; inferior scar; mild-moderate peri-infarct ischemia  . Hypertension   . Hyperlipidemia   . GERD (gastroesophageal reflux disease)   . Colon polyps   . Hemorrhoids    Past Surgical History  Procedure Laterality Date  . Tonsillectomy    . Colonoscopy w/ polypectomy  2010  . Left heart catheterization  04/09/10  . Ptca  04/09/10     with placement two overlapping drug-eluting stents in the mid right coronary artery.    family history includes Diabetes in his father; Heart attack in his father; Heart disease in his father and other; Kidney disease in his maternal aunt, maternal uncle, paternal aunt, and paternal uncle; Liver disease in his paternal uncle; Prostate cancer in his paternal uncle; and Sudden death in his cousin. Current Outpatient Prescriptions  Medication Sig Dispense Refill  . Ascorbic Acid (VITAMIN C) 1000 MG tablet Take 1,000 mg by mouth daily.        Marland Kitchen  aspirin 81 MG tablet Take 81 mg by mouth daily.        . fish oil-omega-3 fatty acids 1000 MG capsule Take 1 g by mouth daily.        Marland Kitchen lisinopril (PRINIVIL,ZESTRIL) 10 MG tablet TAKE 1 TABLET BY MOUTH ONCE DAILY  60 tablet  5  . metoprolol succinate (TOPROL-XL) 25 MG 24 hr tablet TAKE 1 TABLET (25 MG TOTAL) BY MOUTH DAILY.  30 tablet  11  . NITROSTAT 0.4 MG SL tablet PLACE 1 TABLET UNDER TONGUE EVERY 5 MINS FOR 3 DOSES AS NEEDED  25 tablet  2  . pantoprazole (PROTONIX) 40 MG tablet TAKE 1 TABLET (40 MG TOTAL) BY MOUTH DAILY.  30 tablet  11  . Probiotic Product (ALIGN PO) Take by mouth daily.      . rosuvastatin (CRESTOR) 40 MG tablet Take 1 tablet (40 mg total) by mouth daily.  30 tablet  11   No current facility-administered medications for this visit.   Allergies as of 08/31/2012 - Review Complete 08/31/2012  Allergen Reaction Noted  . Atorvastatin    . Butenafine    . Varenicline tartrate      reports that he quit smoking about 7 years ago. His smoking use included Cigarettes. He smoked 0.00 packs per day. He has never used smokeless tobacco. He reports that  drinks alcohol. He reports that he does not use illicit drugs.     Review of Systems: Pertinent positive and  negative review of systems were noted in the above HPI section. All other review of systems were otherwise negative.  Vital signs were reviewed in today's medical record Physical Exam: General: Well developed , well nourished, no acute distress Skin: anicteric Head: Normocephalic and atraumatic Eyes:  sclerae anicteric, EOMI Ears: Normal auditory acuity Mouth: No deformity or lesions Neck: Supple, no masses or thyromegaly Lungs: Clear throughout to auscultation Heart: Regular rate and rhythm; no murmurs, rubs or bruits Abdomen: Soft, non tender and non distended. No masses, hepatosplenomegaly or hernias noted. Normal Bowel sounds Rectal:deferred Musculoskeletal: Symmetrical with no gross deformities  Skin: No  lesions on visible extremities Pulses:  Normal pulses noted Extremities: No clubbing, cyanosis, edema or deformities noted Neurological: Alert oriented x 4, grossly nonfocal Cervical Nodes:  No significant cervical adenopathy Inguinal Nodes: No significant inguinal adenopathy Psychological:  Alert and cooperative. Normal mood and affect

## 2012-08-31 NOTE — Assessment & Plan Note (Addendum)
Recurrent severe diarrhea initially with concurrent antibiotic use, C. Difficile toxin negative in May, 2014, now with recurrent, severe diarrhea and rectal bleeding. I suspect the patient has colitis, be it pseudomembranous colitis or postinfectious colitis.  Rectal bleeding may be related to this versus bleeding from hemorrhoids.  Recommendations #1 sigmoidoscopy

## 2012-09-01 ENCOUNTER — Ambulatory Visit (AMBULATORY_SURGERY_CENTER): Payer: BC Managed Care – PPO | Admitting: Gastroenterology

## 2012-09-01 ENCOUNTER — Encounter: Payer: Self-pay | Admitting: Gastroenterology

## 2012-09-01 ENCOUNTER — Other Ambulatory Visit: Payer: Self-pay | Admitting: *Deleted

## 2012-09-01 VITALS — BP 117/79 | HR 66 | Temp 98.0°F | Resp 17 | Ht 70.0 in | Wt 238.0 lb

## 2012-09-01 DIAGNOSIS — K5289 Other specified noninfective gastroenteritis and colitis: Secondary | ICD-10-CM

## 2012-09-01 DIAGNOSIS — K529 Noninfective gastroenteritis and colitis, unspecified: Secondary | ICD-10-CM

## 2012-09-01 DIAGNOSIS — D126 Benign neoplasm of colon, unspecified: Secondary | ICD-10-CM

## 2012-09-01 DIAGNOSIS — R197 Diarrhea, unspecified: Secondary | ICD-10-CM

## 2012-09-01 MED ORDER — SODIUM CHLORIDE 0.9 % IV SOLN: 500.0000 mL | INTRAVENOUS | Status: DC

## 2012-09-01 MED ORDER — MESALAMINE 1.2 G PO TBEC: 2.4000 g | DELAYED_RELEASE_TABLET | Freq: Every day | ORAL | Status: DC

## 2012-09-01 NOTE — Progress Notes (Signed)
Stable to RR 

## 2012-09-01 NOTE — Op Note (Signed)
 [facility] [address] [place], [address]   FLEXIBLE SIGMOIDOSCOPY PROCEDURE REPORT  PATIENT: Richard Baldwin, Richard Baldwin  MR#: [id number] BIRTHDATE: Jun 27, 1955 , 56  yrs. old GENDER: Male ENDOSCOPIST: Louis Meckel, MD REFERRED BY: PROCEDURE DATE:  09/01/2012 PROCEDURE:   Sigmoidoscopy with biopsy ASA CLASS:   Class II INDICATIONS:unexplained diarrhea. MEDICATIONS: MAC sedation, administered by CRNA and Propofol (Diprivan) 120 mg IV  DESCRIPTION OF PROCEDURE:   After the risks benefits and alternatives of the procedure were thoroughly explained, informed consent was obtained.  revealed no abnormalities of the rectum. The LB [id number]  endoscope was introduced through the anus  and advanced to the descending colon , limited by No adverse events experienced.   The quality of the prep was    .  The instrument was then slowly withdrawn as the mucosa was fully examined.       1.  COLON FINDINGS: Diffuse colitis was present characterized by erythematous mucosa with areas of submucosal hemorrhage.  Changes were mild to moderate.  They were seen throughout the left colon. Multiple biopsies were obtained.  no pseudomembranes, erosions or ulcers were seen.  The scope was then withdrawn from the patient and the procedure terminated.  COMPLICATIONS: There were no complications.  ENDOSCOPIC IMPRESSION: nonspecific colitis  RECOMMENDATIONS: 1.  Begin lialda 2.4 g daily 2.  stool for C. difficile toxin 3.  office visit 2 weeks  REPEAT EXAM:   _______________________________ eSignedLouis Meckel, MD 09/01/2012 4:40 PM   XB:JYNWG Fransisca Connors, MD Zelphia Cairo MD  PATIENT NAME:  Richard Baldwin, Richard Baldwin MR#: [id number]

## 2012-09-01 NOTE — Progress Notes (Signed)
Called to room to assist during endoscopic procedure.  Patient ID and intended procedure confirmed with present staff. Received instructions for my participation in the procedure from the performing physician. ewm 

## 2012-09-01 NOTE — Patient Instructions (Addendum)
Discharge instructions given with verbal understanding. Biopsies taken. Resume previous medications. YOU HAD AN ENDOSCOPIC PROCEDURE TODAY AT THE [facility]: Refer to the procedure report that was given to you for any specific questions about what was found during the examination.  If the procedure report does not answer your questions, please call your gastroenterologist to clarify.  If you requested that your care partner not be given the details of your procedure findings, then the procedure report has been included in a sealed envelope for you to review at your convenience later.  YOU SHOULD EXPECT: Some feelings of bloating in the abdomen. Passage of more gas than usual.  Walking can help get rid of the air that was put into your GI tract during the procedure and reduce the bloating. If you had a lower endoscopy (such as a colonoscopy or flexible sigmoidoscopy) you may notice spotting of blood in your stool or on the toilet paper. If you underwent a bowel prep for your procedure, then you may not have a normal bowel movement for a few days.  DIET: Your first meal following the procedure should be a light meal and then it is ok to progress to your normal diet.  A half-sandwich or bowl of soup is an example of a good first meal.  Heavy or fried foods are harder to digest and may make you feel nauseous or bloated.  Likewise meals heavy in dairy and vegetables can cause extra gas to form and this can also increase the bloating.  Drink plenty of fluids but you should avoid alcoholic beverages for 24 hours.  ACTIVITY: Your care partner should take you home directly after the procedure.  You should plan to take it easy, moving slowly for the rest of the day.  You can resume normal activity the day after the procedure however you should NOT DRIVE or use heavy machinery for 24 hours (because of the sedation medicines used during the test).    SYMPTOMS TO REPORT IMMEDIATELY: A gastroenterologist  can be reached at any hour.  During normal business hours, 8:30 AM to 5:00 PM Monday through Friday, call [phone].  After hours and on weekends, please call the GI answering service at [phone] who will take a message and have the physician on call contact you.   Following lower endoscopy (colonoscopy or flexible sigmoidoscopy):  Excessive amounts of blood in the stool  Significant tenderness or worsening of abdominal pains  Swelling of the abdomen that is new, acute  Fever of 100F or higher  FOLLOW UP: If any biopsies were taken you will be contacted by phone or by letter within the next 1-3 weeks.  Call your gastroenterologist if you have not heard about the biopsies in 3 weeks.  Our staff will call the home number listed on your records the next business day following your procedure to check on you and address any questions or concerns that you may have at that time regarding the information given to you following your procedure. This is a courtesy call and so if there is no answer at the home number and we have not heard from you through the emergency physician on call, we will assume that you have returned to your regular daily activities without incident.  SIGNATURES/CONFIDENTIALITY: You and/or your care partner have signed paperwork which will be entered into your electronic medical record.  These signatures attest to the fact that that the information above on your After Visit Summary has been reviewed   and is understood.  Full responsibility of the confidentiality of this discharge information lies with you and/or your care-partner.  

## 2012-09-01 NOTE — Progress Notes (Signed)
Patient did not experience any of the following events: a burn prior to discharge; a fall within the facility; wrong site/side/patient/procedure/implant event; or a hospital transfer or hospital admission upon discharge from the facility. (G8907) Patient did not have preoperative order for IV antibiotic SSI prophylaxis. (G8918)  

## 2012-09-02 ENCOUNTER — Telehealth: Payer: Self-pay | Admitting: *Deleted

## 2012-09-02 NOTE — Telephone Encounter (Signed)
  Follow up Call-  Call back number 09/01/2012  Post procedure Call Back phone  # [phone]  Permission to leave phone message Yes     Patient questions:  Do you have a fever, pain , or abdominal swelling? no Pain Score  0 *  Have you tolerated food without any problems? yes  Have you been able to return to your normal activities? yes  Do you have any questions about your discharge instructions: Diet   no Medications  no Follow up visit  no  Do you have questions or concerns about your Care? no  Actions: * If pain score is 4 or above: No action needed, pain <4.

## 2012-09-05 ENCOUNTER — Other Ambulatory Visit: Payer: BC Managed Care – PPO

## 2012-09-05 DIAGNOSIS — R197 Diarrhea, unspecified: Secondary | ICD-10-CM

## 2012-09-06 LAB — CLOSTRIDIUM DIFFICILE BY PCR: Toxigenic C. Difficile by PCR: NOT DETECTED

## 2012-09-09 ENCOUNTER — Encounter: Payer: Self-pay | Admitting: Gastroenterology

## 2012-10-06 ENCOUNTER — Ambulatory Visit (INDEPENDENT_AMBULATORY_CARE_PROVIDER_SITE_OTHER): Payer: BC Managed Care – PPO | Admitting: Gastroenterology

## 2012-10-06 ENCOUNTER — Encounter: Payer: Self-pay | Admitting: Gastroenterology

## 2012-10-06 VITALS — BP 122/80 | HR 76 | Ht 72.0 in | Wt 241.1 lb

## 2012-10-06 DIAGNOSIS — R197 Diarrhea, unspecified: Secondary | ICD-10-CM

## 2012-10-06 NOTE — Patient Instructions (Addendum)
Follow up as needed

## 2012-10-06 NOTE — Assessment & Plan Note (Addendum)
Diarrhea following antibiotic therapy for dental procedure, C. difficile negative, and very mild erythema at sigmoidoscopy with negative biopsies.  Patient clearly has improved over the past month, possibly coincidental to lialda.  Plan to discontinue lialda.  Should symptoms recur then I instructed the patient to restart lialda and to followup in 3 months.  If he continues to do well off of lialda he does not require specific followup.

## 2012-10-06 NOTE — Progress Notes (Signed)
History of Present Illness:  Richard Baldwin has returned for followup of diarrhea.  Colonoscopy demonstrated a nonspecific colitis although biopsies were negative.  He was treated empirically with lialda.  He reports almost complete resolution of his diarrheal symptoms.    Review of Systems: Pertinent positive and negative review of systems were noted in the above HPI section. All other review of systems were otherwise negative.    Current Medications, Allergies, Past Medical History, Past Surgical History, Family History and Social History were reviewed in [employer] [facility] electronic medical record  Vital signs were reviewed in today's medical record. Physical Exam: General: Well developed , well nourished, no acute distress

## 2012-10-11 ENCOUNTER — Ambulatory Visit: Payer: BC Managed Care – PPO | Admitting: Cardiology

## 2012-10-26 ENCOUNTER — Ambulatory Visit (INDEPENDENT_AMBULATORY_CARE_PROVIDER_SITE_OTHER): Payer: BC Managed Care – PPO | Admitting: Cardiology

## 2012-10-26 ENCOUNTER — Encounter: Payer: Self-pay | Admitting: Cardiology

## 2012-10-26 VITALS — BP 149/85 | HR 63 | Ht 72.0 in | Wt 239.0 lb

## 2012-10-26 DIAGNOSIS — I10 Essential (primary) hypertension: Secondary | ICD-10-CM

## 2012-10-26 DIAGNOSIS — I2510 Atherosclerotic heart disease of native coronary artery without angina pectoris: Secondary | ICD-10-CM

## 2012-10-26 DIAGNOSIS — E7800 Pure hypercholesterolemia, unspecified: Secondary | ICD-10-CM

## 2012-10-26 NOTE — Patient Instructions (Signed)
Your physician wants you to follow-up in: ONE YEAR WITH DR CRENSHAW You will receive a reminder letter in the mail two months in advance. If you don't receive a letter, please call our office to schedule the follow-up appointment.  

## 2012-10-26 NOTE — Assessment & Plan Note (Signed)
Continue statin. 

## 2012-10-26 NOTE — Assessment & Plan Note (Signed)
Blood pressure is mildly elevated today. I have asked him to follow this at home. If his systolic averages greater than 130 I will increase his lisinopril.

## 2012-10-26 NOTE — Assessment & Plan Note (Signed)
Continue aspirin and statin. 

## 2012-10-26 NOTE — Progress Notes (Signed)
HPI: fu CAD; admitted from the office on 04/07/10 with unstable angina. He had Myoview study that day that was abnormal with inferior scar and mild to moderate peri-Infarct ischemia. EF was 48%. Cardiac catheterization demonstrated 30% LAD stenosis and mild plaque in the circumflex. He had a mid 99% and then an ulcerated 80% stenosis in the RCA. This was treated with 2 drug-eluting stents (Promus). He also had a 90% ostial posterior lateral branch stenosis. There are left to right collaterals. EF 55%. I last saw him in Jan 2014. Since he was last seen, there is no dyspnea on exertion, orthopnea, PND or pedal edema. There is no exertional chest pain.     Current Outpatient Prescriptions  Medication Sig Dispense Refill  . Ascorbic Acid (VITAMIN C) 1000 MG tablet Take 1,000 mg by mouth daily.        Marland Kitchen aspirin 81 MG tablet Take 81 mg by mouth daily.        . fish oil-omega-3 fatty acids 1000 MG capsule Take 1 g by mouth daily.        Marland Kitchen lisinopril (PRINIVIL,ZESTRIL) 10 MG tablet TAKE 1 TABLET BY MOUTH ONCE DAILY  60 tablet  5  . metoprolol succinate (TOPROL-XL) 25 MG 24 hr tablet TAKE 1 TABLET (25 MG TOTAL) BY MOUTH DAILY.  30 tablet  11  . NITROSTAT 0.4 MG SL tablet PLACE 1 TABLET UNDER TONGUE EVERY 5 MINS FOR 3 DOSES AS NEEDED  25 tablet  2  . pantoprazole (PROTONIX) 40 MG tablet TAKE 1 TABLET (40 MG TOTAL) BY MOUTH DAILY.  30 tablet  11  . rosuvastatin (CRESTOR) 40 MG tablet Take 1 tablet (40 mg total) by mouth daily.  30 tablet  11   No current facility-administered medications for this visit.     Past Medical History  Diagnosis Date  . CAD (coronary artery disease)     s/p Promus DES x 2 RCA 04/08/10;  Cath 2/12: mLAD 30%; CFX mild plaque; mRCA 99% then ulc. 80% tx'd with PCI, dRCA 30%; oPLB 90% (L-R collat); EF 55%  . Abnormal nuclear cardiac imaging test 04/07/10    EF 48%; inferior scar; mild-moderate peri-infarct ischemia  . Hypertension   . Hyperlipidemia   . GERD (gastroesophageal  reflux disease)   . Colon polyps   . Hemorrhoids     Past Surgical History  Procedure Laterality Date  . Tonsillectomy    . Colonoscopy w/ polypectomy  2010  . Left heart catheterization  04/09/10  . Ptca  04/09/10     with placement two overlapping drug-eluting stents in the mid right coronary artery.     History   Social History  . Marital Status: Married    Spouse Name: N/A    Number of Children: N/A  . Years of Education: N/A   Occupational History  . Not on file.   Social History Main Topics  . Smoking status: Former Smoker    Types: Cigarettes    Quit date: 02/23/2005  . Smokeless tobacco: Never Used     Comment: Quit smoking 02/2005.  Marland Kitchen Alcohol Use: 2.4 oz/week    4 Cans of beer per week  . Drug Use: No  . Sexual Activity: Not on file   Other Topics Concern  . Not on file   Social History Narrative   Regular exercise at gym.    ROS: no fevers or chills, productive cough, hemoptysis, dysphasia, odynophagia, melena, hematochezia, dysuria, hematuria, rash, seizure activity, orthopnea, PND, pedal edema,  claudication. Remaining systems are negative.  Physical Exam: Well-developed well-nourished in no acute distress.  Skin is warm and dry.  HEENT is normal.  Neck is supple.  Chest is clear to auscultation with normal expansion.  Cardiovascular exam is regular rate and rhythm.  Abdominal exam nontender or distended. No masses palpated. Extremities show no edema. neuro grossly intact  ECG sinus rhythm at a rate of 65. No ST changes.

## 2012-11-20 ENCOUNTER — Other Ambulatory Visit: Payer: Self-pay | Admitting: Cardiology

## 2012-12-29 ENCOUNTER — Other Ambulatory Visit: Payer: Self-pay

## 2013-01-11 ENCOUNTER — Telehealth: Payer: Self-pay | Admitting: Family Medicine

## 2013-01-11 DIAGNOSIS — Z125 Encounter for screening for malignant neoplasm of prostate: Secondary | ICD-10-CM

## 2013-01-11 DIAGNOSIS — E7800 Pure hypercholesterolemia, unspecified: Secondary | ICD-10-CM

## 2013-01-11 DIAGNOSIS — Z0000 Encounter for general adult medical examination without abnormal findings: Secondary | ICD-10-CM

## 2013-01-11 NOTE — Telephone Encounter (Signed)
Message copied by Judy Pimple on Wed Jan 11, 2013 10:32 AM ------      Message from: Alvina Chou      Created: Thu Jan 05, 2013  9:50 AM      Regarding: Lab orders for Thursday, 11.20.14       Patient is scheduled for CPX labs, please order future labs, Thanks , Terri       ------

## 2013-01-13 ENCOUNTER — Other Ambulatory Visit (INDEPENDENT_AMBULATORY_CARE_PROVIDER_SITE_OTHER): Payer: BC Managed Care – PPO

## 2013-01-13 DIAGNOSIS — E7800 Pure hypercholesterolemia, unspecified: Secondary | ICD-10-CM

## 2013-01-13 DIAGNOSIS — Z0000 Encounter for general adult medical examination without abnormal findings: Secondary | ICD-10-CM

## 2013-01-13 DIAGNOSIS — Z125 Encounter for screening for malignant neoplasm of prostate: Secondary | ICD-10-CM

## 2013-01-13 DIAGNOSIS — I10 Essential (primary) hypertension: Secondary | ICD-10-CM

## 2013-01-13 LAB — COMPREHENSIVE METABOLIC PANEL
ALT: 39 U/L (ref 0–53)
Albumin: 4.2 g/dL (ref 3.5–5.2)
CO2: 29 mEq/L (ref 19–32)
Calcium: 9.8 mg/dL (ref 8.4–10.5)
Chloride: 100 mEq/L (ref 96–112)
GFR: 73.9 mL/min (ref >60.00)
Potassium: 4.5 mEq/L (ref 3.5–5.1)
Sodium: 136 mEq/L (ref 135–145)
Total Bilirubin: 1 mg/dL (ref 0.3–1.2)
Total Protein: 6.8 g/dL (ref 6.0–8.3)

## 2013-01-13 LAB — CBC WITH DIFFERENTIAL/PLATELET
Basophils Absolute: 0 10*3/uL (ref 0.0–0.1)
HCT: 41.6 % (ref 39.0–52.0)
Lymphocytes Relative: 23.3 % (ref 12.0–46.0)
Lymphs Abs: 1.9 10*3/uL (ref 0.7–4.0)
Monocytes Relative: 9.3 % (ref 3.0–12.0)
Neutro Abs: 5.1 10*3/uL (ref 1.4–7.7)
Neutrophils Relative %: 62.1 % (ref 43.0–77.0)
Platelets: 225 10*3/uL (ref 150.0–400.0)
RDW: 12.9 % (ref 11.5–14.6)
WBC: 8.2 10*3/uL (ref 4.5–10.5)

## 2013-01-13 LAB — TSH: TSH: 1.31 u[IU]/mL (ref 0.35–5.50)

## 2013-01-13 LAB — LIPID PANEL
LDL Cholesterol: 54 mg/dL (ref 0–99)
Triglycerides: 171 mg/dL — ABNORMAL HIGH (ref 0.0–149.0)
VLDL: 34.2 mg/dL (ref 0.0–40.0)

## 2013-01-13 LAB — PSA: PSA: 1.83 ng/mL (ref 0.10–4.00)

## 2013-01-20 ENCOUNTER — Encounter: Payer: Self-pay | Admitting: Family Medicine

## 2013-01-20 ENCOUNTER — Ambulatory Visit (INDEPENDENT_AMBULATORY_CARE_PROVIDER_SITE_OTHER): Payer: BC Managed Care – PPO | Admitting: Family Medicine

## 2013-01-20 VITALS — BP 148/88 | HR 77 | Temp 97.9°F | Ht 70.75 in | Wt 238.5 lb

## 2013-01-20 DIAGNOSIS — Z125 Encounter for screening for malignant neoplasm of prostate: Secondary | ICD-10-CM

## 2013-01-20 DIAGNOSIS — E7800 Pure hypercholesterolemia, unspecified: Secondary | ICD-10-CM

## 2013-01-20 DIAGNOSIS — I10 Essential (primary) hypertension: Secondary | ICD-10-CM

## 2013-01-20 DIAGNOSIS — Z0000 Encounter for general adult medical examination without abnormal findings: Secondary | ICD-10-CM

## 2013-01-20 DIAGNOSIS — J069 Acute upper respiratory infection, unspecified: Secondary | ICD-10-CM | POA: Insufficient documentation

## 2013-01-20 MED ORDER — ROSUVASTATIN CALCIUM 40 MG PO TABS: 40.0000 mg | ORAL_TABLET | Freq: Every day | ORAL | Status: DC

## 2013-01-20 NOTE — Progress Notes (Signed)
Subjective:    Patient ID: Richard Baldwin, male    DOB: 1955/03/20, 57 y.o.   MRN: [id number]  HPI Here for health maintenance exam and to review chronic medical problems    He had a good thanksgiving- put up holiday decorations  Getting over a head and chest cold - for about 10 days or so  No sinus pain/ some congestion  Cough - some phlegm -sometimes - yellow  No sob  No fever  For symptoms - tried asa, and mucinex (GI symptoms from that)   Wt is stable with bmi of 33  bp is stable today - better on 2nd check 140/80 No cp or palpitations or headaches or edema  No side effects to medicines - took his medicines late today BP Readings from Last 3 Encounters:  01/20/13 148/88  10/26/12 149/85  10/06/12 122/80     Flu vaccine 10/14 Td 11/09 colonosc 1/10 and flex sig 7/14 - for colitis- (is over that now ) - took liada for a while (Dr Arlyce Dice)   Lab Results  Component Value Date   PSA 1.83 01/13/2013   PSA 1.74 01/08/2012   PSA 1.95 01/05/2011   no close fam hx of prostate ca - just an uncle  No problems emptying bladder  Nocturia 0-1 times per night -no change in that for years     Chemistry      Component Value Date/Time   NA 136 01/13/2013 0919   K 4.5 01/13/2013 0919   CL 100 01/13/2013 0919   CO2 29 01/13/2013 0919   BUN 19 01/13/2013 0919   CREATININE 1.1 01/13/2013 0919   CREATININE 0.98 07/29/2012 1644      Component Value Date/Time   CALCIUM 9.8 01/13/2013 0919   ALKPHOS 50 01/13/2013 0919   AST 30 01/13/2013 0919   ALT 39 01/13/2013 0919   BILITOT 1.0 01/13/2013 0919     glucose in 90s  Lab Results  Component Value Date   WBC 8.2 01/13/2013   HGB 14.2 01/13/2013   HCT 41.6 01/13/2013   MCV 91.5 01/13/2013   PLT 225.0 01/13/2013   Lab Results  Component Value Date   TSH 1.31 01/13/2013    Hyperlipidemia Lab Results  Component Value Date   CHOL 123 01/13/2013   CHOL 136 04/14/2012   CHOL 170 02/12/2012   Lab Results  Component  Value Date   HDL 34.80* 01/13/2013   HDL 46.40 04/14/2012   HDL 40.98 02/12/2012   Lab Results  Component Value Date   LDLCALC 54 01/13/2013   LDLCALC 69 04/14/2012   LDLCALC 88 01/08/2012   Lab Results  Component Value Date   TRIG 171.0* 01/13/2013   TRIG 103.0 04/14/2012   TRIG 206.0* 02/12/2012   Lab Results  Component Value Date   CHOLHDL 4 01/13/2013   CHOLHDL 3 04/14/2012   CHOLHDL 4 02/12/2012   Lab Results  Component Value Date   LDLDIRECT 99.3 02/12/2012   LDLDIRECT 124.5 08/19/2009   LDLDIRECT 105.3 03/04/2009   not a lot of exercise due to work schedule and eating is not good either   Patient Active Problem List   Diagnosis Date Noted  . Blood in stool 08/29/2012  . Abdominal bloating 07/29/2012  . Diarrhea 07/15/2012  . Routine general medical examination at a health care facility 12/08/2010  . Prostate cancer screening 12/08/2010  . CAD (coronary artery disease)   . UNSTABLE ANGINA 04/07/2010  . ABNORMAL EKG 03/04/2010  . CHEST  PAIN 02/28/2010  . COUGH 01/27/2010  . HYPERCHOLESTEROLEMIA 05/28/2006  . HYPERTENSION 05/28/2006  . HEMORRHOIDS, WITH BLEEDING 05/28/2006  . GERD 05/28/2006  . TOBACCO USE, QUIT 05/28/2006   Past Medical History  Diagnosis Date  . CAD (coronary artery disease)     s/p Promus DES x 2 RCA 04/08/10;  Cath 2/12: mLAD 30%; CFX mild plaque; mRCA 99% then ulc. 80% tx'd with PCI, dRCA 30%; oPLB 90% (L-R collat); EF 55%  . Abnormal nuclear cardiac imaging test 04/07/10    EF 48%; inferior scar; mild-moderate peri-infarct ischemia  . Hypertension   . Hyperlipidemia   . GERD (gastroesophageal reflux disease)   . Colon polyps   . Hemorrhoids    Past Surgical History  Procedure Laterality Date  . Tonsillectomy    . Colonoscopy w/ polypectomy  2010  . Left heart catheterization  04/09/10  . Ptca  04/09/10     with placement two overlapping drug-eluting stents in the mid right coronary artery.    History  Substance Use Topics  .  Smoking status: Former Smoker    Types: Cigarettes    Quit date: 02/23/2005  . Smokeless tobacco: Never Used     Comment: Quit smoking 02/2005.  Marland Kitchen Alcohol Use: 2.4 oz/week    4 Cans of beer per week     Comment: occ   Family History  Problem Relation Age of Onset  . Heart attack Father   . Diabetes Father   . Heart disease Father     CAD  . Heart disease Other     Strong family hx of CAD  . Sudden death Cousin     ? Cardiac  . Kidney disease Maternal Aunt   . Kidney disease Maternal Uncle   . Kidney disease Paternal Aunt   . Prostate cancer Paternal Uncle   . Liver disease Paternal Uncle   . Kidney disease Paternal Uncle    Allergies  Allergen Reactions  . Atorvastatin     REACTION: elevated liver functions  . Butenafine     REACTION: burned  . Varenicline Tartrate     REACTION: sleepy   Current Outpatient Prescriptions on File Prior to Visit  Medication Sig Dispense Refill  . Ascorbic Acid (VITAMIN C) 1000 MG tablet Take 1,000 mg by mouth daily.        Marland Kitchen aspirin 81 MG tablet Take 81 mg by mouth daily.        . fish oil-omega-3 fatty acids 1000 MG capsule Take 1 g by mouth daily.        Marland Kitchen lisinopril (PRINIVIL,ZESTRIL) 10 MG tablet TAKE 2 TABLETS (20 MG TOTAL) BY MOUTH DAILY.  60 tablet  10  . metoprolol succinate (TOPROL-XL) 25 MG 24 hr tablet TAKE 1 TABLET (25 MG TOTAL) BY MOUTH DAILY.  30 tablet  11  . NITROSTAT 0.4 MG SL tablet PLACE 1 TABLET UNDER TONGUE EVERY 5 MINS FOR 3 DOSES AS NEEDED  25 tablet  2  . pantoprazole (PROTONIX) 40 MG tablet TAKE 1 TABLET (40 MG TOTAL) BY MOUTH DAILY.  30 tablet  11  . rosuvastatin (CRESTOR) 40 MG tablet Take 1 tablet (40 mg total) by mouth daily.  30 tablet  11   No current facility-administered medications on file prior to visit.    Review of Systems Review of Systems  Constitutional: Negative for fever, appetite change,  and unexpected weight change. pos for fatigue from schedule  Eyes: Negative for pain and visual  disturbance.  Respiratory:  Negative for cough and shortness of breath.   Cardiovascular: Negative for cp or palpitations    Gastrointestinal: Negative for nausea, diarrhea and constipation.  Genitourinary: Negative for urgency and frequency.  Skin: Negative for pallor or rash   Neurological: Negative for weakness, light-headedness, numbness and headaches.  Hematological: Negative for adenopathy. Does not bruise/bleed easily.  Psychiatric/Behavioral: Negative for dysphoric mood. The patient is not nervous/anxious.         Objective:   Physical Exam  Nursing note and vitals reviewed. Constitutional: He appears well-developed and well-nourished. No distress.  obese and well appearing   HENT:  Head: Normocephalic and atraumatic.  Right Ear: External ear normal.  Left Ear: External ear normal.  Nose: Nose normal.  Mouth/Throat: Oropharynx is clear and moist.  Eyes: Conjunctivae and EOM are normal. Pupils are equal, round, and reactive to light. Right eye exhibits no discharge. Left eye exhibits no discharge. No scleral icterus.  Neck: Normal range of motion. Neck supple. No JVD present. Carotid bruit is not present. No thyromegaly present.  Cardiovascular: Normal rate, regular rhythm, normal heart sounds and intact distal pulses.  Exam reveals no gallop.   Pulmonary/Chest: Effort normal and breath sounds normal. No respiratory distress. He has no wheezes. He exhibits no tenderness.  Abdominal: Soft. Bowel sounds are normal. He exhibits no distension, no abdominal bruit and no mass. There is no tenderness.  Genitourinary:  Prostate exam def today  Nl psa and no symptoms or immed fam hx  Musculoskeletal: He exhibits no edema and no tenderness.  Lymphadenopathy:    He has no cervical adenopathy.  Neurological: He is alert. He has normal reflexes. No cranial nerve deficit. He exhibits normal muscle tone. Coordination normal.  Skin: Skin is warm and dry. No rash noted. No erythema. No  pallor.  Psychiatric: He has a normal mood and affect.          Assessment & Plan:

## 2013-01-20 NOTE — Progress Notes (Signed)
Pre-visit discussion using our clinic review tool. No additional management support is needed unless otherwise documented below in the visit note.  

## 2013-01-20 NOTE — Patient Instructions (Addendum)
Drink lots of fluids and try to get some rest  Breathe steam and try nasal saline spray for congestion Tylenol as needed for pain  Try delsym or robitussin dm to help cough and also break up congestion (take it with food)  Update if not starting to improve in a week or if worsening   Follow up with cardiology when you are due I sent in your crestor

## 2013-01-22 NOTE — Assessment & Plan Note (Signed)
Reassuring exam Disc symptomatic care - see instructions on AVS Update if not starting to improve in a week or if worsening   

## 2013-01-22 NOTE — Assessment & Plan Note (Signed)
Reviewed health habits including diet and exercise and skin cancer prevention Reviewed appropriate screening tests for age  Also reviewed health mt list, fam hx and immunization status , as well as social and family history   See HPI Wellness lab reviewed  

## 2013-01-22 NOTE — Assessment & Plan Note (Signed)
Lab Results  Component Value Date   PSA 1.83 01/13/2013   PSA 1.74 01/08/2012   PSA 1.95 01/05/2011    Pt elected to skip DRE due to lack of family hx and symptoms and stable psa

## 2013-01-22 NOTE — Assessment & Plan Note (Signed)
BP: 148/88 mmHg   Repeat 140/80- borderline  bp in fair control at this time  No changes needed Disc lifstyle change with low sodium diet and exercise

## 2013-01-22 NOTE — Assessment & Plan Note (Signed)
Disc goals for lipids and reasons to control them Rev labs with pt Rev low sat fat diet in detail  crestor refilled

## 2013-01-23 ENCOUNTER — Telehealth: Payer: Self-pay

## 2013-01-23 NOTE — Telephone Encounter (Signed)
Done and in IN box 

## 2013-01-23 NOTE — Telephone Encounter (Signed)
Mrs Losito left v/m that pt needs letter for wellness program thru insurance that pt had annual physical on 01/20/13.

## 2013-01-23 NOTE — Telephone Encounter (Signed)
Letter mailed to pt, per pt's wife

## 2013-04-10 ENCOUNTER — Telehealth: Payer: Self-pay | Admitting: Cardiology

## 2013-04-10 NOTE — Telephone Encounter (Signed)
Pt bp improving 150/94.  Does not currently have any pain.  Informed pt I spoke to Dr. Stanford Breed, he confirms he's unable to see patient. Physician extendter schedules are also full at this time.  Feels that if pt feels bad he should go to ED for eval.  Instructed pt to try some advil or tylenol in case it is musculoskeeltal in nature.  Also encouraged him to take it easy today.  Pt verbalizes understanding of all of the above.

## 2013-04-10 NOTE — Telephone Encounter (Signed)
New message     C/o pain in shoulder and chest---

## 2013-04-10 NOTE — Telephone Encounter (Signed)
Call transferred direct to me.  Pt reports over weekend pain 6/10 across right shoulder and across chest to left shoulder at times.  Lasts only seconds each time.  No nitro taken.  Does not occur at rest.  Walked  2 miles, did not occur.  Felt it shaving and doing laundry.    It is not accompanied by any other symptoms.  He asks for appointment with Dr. Stanford Breed today. Informed him Dr.s schedule is full already.  His BP 172/109,   Took Am medications about 45 min prior. He reports being high energy, always working or moving around.  Instructed him to sit still and recheck BP in about an hour and I would call him back after speaking with Dr. Stanford Breed.

## 2013-04-27 ENCOUNTER — Other Ambulatory Visit: Payer: Self-pay | Admitting: Cardiology

## 2013-10-16 ENCOUNTER — Telehealth: Payer: Self-pay | Admitting: *Deleted

## 2013-10-16 MED ORDER — ROSUVASTATIN CALCIUM 20 MG PO TABS: 40.0000 mg | ORAL_TABLET | Freq: Every day | ORAL | Status: DC

## 2013-10-16 NOTE — Telephone Encounter (Signed)
Wife walked in with triage message: "[facility] has sent two message to Dr. Stanford Breed to get prescription filled with no response"  Called wife to clarify which medication is needed. Crestor 40mg .  She reports husband just changed jobs/insurance and now needs prior authorization for Crestor.   Wife states that PCP, Dr. Glori Bickers, checks his cholesterol - informed wife that in our computer system it shows that Dr. Glori Bickers last refilled this medication 01/20/13 with 30 tablets and 11 refills.   Advised wife that if PCP prescribes the medication, she may need to call their office.   Crestor 20mg  samples (take 2 by mouth daily) will be left at front for wife to pick up for her husband until prior approval can be obtained.   Message forwarded to [employer]/Debra, RN

## 2013-10-16 NOTE — Telephone Encounter (Signed)
Called insurance and they require PA form faxed to them, form filled out and placed in Dr. Marliss Coots inbox, will mark as urgent

## 2013-10-16 NOTE — Telephone Encounter (Signed)
Mrs Coate left v/m; pts ins has changed to [place] will not fill crestor without prior authorization; pt has 3 pills left. Mrs Dauber request cb.

## 2013-10-16 NOTE — Telephone Encounter (Signed)
Done and in IN box 

## 2013-10-17 NOTE — Telephone Encounter (Signed)
PA faxed as urgent

## 2013-10-17 NOTE — Telephone Encounter (Signed)
PA was approved, letter received by fax (signed by Dr. Glori Bickers and sent to scanning), pt and pharmacy notified

## 2013-10-24 ENCOUNTER — Other Ambulatory Visit: Payer: Self-pay

## 2013-10-24 MED ORDER — LISINOPRIL 10 MG PO TABS: ORAL_TABLET | ORAL | Status: DC

## 2013-10-27 ENCOUNTER — Ambulatory Visit: Payer: BC Managed Care – PPO | Admitting: Cardiology

## 2013-11-03 ENCOUNTER — Other Ambulatory Visit: Payer: Self-pay

## 2013-11-03 MED ORDER — PANTOPRAZOLE SODIUM 40 MG PO TBEC: DELAYED_RELEASE_TABLET | ORAL | Status: DC

## 2013-11-03 MED ORDER — METOPROLOL SUCCINATE ER 25 MG PO TB24: ORAL_TABLET | ORAL | Status: DC

## 2013-11-25 ENCOUNTER — Encounter: Payer: Self-pay | Admitting: Gastroenterology

## 2013-11-27 ENCOUNTER — Ambulatory Visit (INDEPENDENT_AMBULATORY_CARE_PROVIDER_SITE_OTHER): Payer: BC Managed Care – PPO | Admitting: Cardiology

## 2013-11-27 ENCOUNTER — Encounter: Payer: Self-pay | Admitting: Cardiology

## 2013-11-27 VITALS — BP 140/70 | HR 79 | Ht 72.0 in | Wt 247.1 lb

## 2013-11-27 DIAGNOSIS — I2510 Atherosclerotic heart disease of native coronary artery without angina pectoris: Secondary | ICD-10-CM

## 2013-11-27 MED ORDER — ROSUVASTATIN CALCIUM 20 MG PO TABS: 40.0000 mg | ORAL_TABLET | Freq: Every day | ORAL | Status: DC

## 2013-11-27 MED ORDER — LISINOPRIL 40 MG PO TABS: 40.0000 mg | ORAL_TABLET | Freq: Every day | ORAL | Status: DC

## 2013-11-27 NOTE — Assessment & Plan Note (Signed)
Continue statin. Lipids and liver monitored by primary care. 

## 2013-11-27 NOTE — Progress Notes (Signed)
HPI: fu CAD; admitted from the office on 04/07/10 with unstable angina. He had Myoview study that day that was abnormal with inferior scar and mild to moderate peri-Infarct ischemia. EF was 48%. Cardiac catheterization demonstrated 30% LAD stenosis and mild plaque in the circumflex. He had a mid 99% and then an ulcerated 80% stenosis in the RCA. This was treated with 2 drug-eluting stents (Promus). He also had a 90% ostial posterior lateral branch stenosis. There are left to right collaterals. EF 55%. Since I last saw him, the patient denies any dyspnea on exertion, orthopnea, PND, pedal edema, palpitations, syncope or chest pain.    Current Outpatient Prescriptions  Medication Sig Dispense Refill  . Ascorbic Acid (VITAMIN C) 1000 MG tablet Take 1,000 mg by mouth daily.        Marland Kitchen aspirin 81 MG tablet Take 81 mg by mouth daily.        . fish oil-omega-3 fatty acids 1000 MG capsule Take 1 g by mouth daily.        Marland Kitchen lisinopril (PRINIVIL,ZESTRIL) 10 MG tablet TAKE 2 TABLETS (20 MG TOTAL) BY MOUTH DAILY.  60 tablet  1  . metoprolol succinate (TOPROL-XL) 25 MG 24 hr tablet TAKE 1 TABLET (25 MG TOTAL) BY MOUTH DAILY.  30 tablet  5  . NITROSTAT 0.4 MG SL tablet PLACE 1 TABLET UNDER TONGUE EVERY 5 MINS FOR 3 DOSES AS NEEDED  25 tablet  2  . pantoprazole (PROTONIX) 40 MG tablet TAKE 1 TABLET (40 MG TOTAL) BY MOUTH DAILY.  30 tablet  5  . rosuvastatin (CRESTOR) 20 MG tablet Take 2 tablets (40 mg total) by mouth daily.  42 tablet  0   No current facility-administered medications for this visit.     Past Medical History  Diagnosis Date  . CAD (coronary artery disease)     s/p Promus DES x 2 RCA 04/08/10;  Cath 2/12: mLAD 30%; CFX mild plaque; mRCA 99% then ulc. 80% tx'd with PCI, dRCA 30%; oPLB 90% (L-R collat); EF 55%  . Abnormal nuclear cardiac imaging test 04/07/10    EF 48%; inferior scar; mild-moderate peri-infarct ischemia  . Hypertension   . Hyperlipidemia   . GERD (gastroesophageal  reflux disease)   . Colon polyps   . Hemorrhoids     Past Surgical History  Procedure Laterality Date  . Tonsillectomy    . Colonoscopy w/ polypectomy  2010  . Left heart catheterization  04/09/10  . Ptca  04/09/10     with placement two overlapping drug-eluting stents in the mid right coronary artery.     History   Social History  . Marital Status: Married    Spouse Name: N/A    Number of Children: N/A  . Years of Education: N/A   Occupational History  . Not on file.   Social History Main Topics  . Smoking status: Former Smoker    Types: Cigarettes    Quit date: 02/23/2005  . Smokeless tobacco: Never Used     Comment: Quit smoking 02/2005.  Marland Kitchen Alcohol Use: 2.4 oz/week    4 Cans of beer per week     Comment: occ  . Drug Use: No  . Sexual Activity: Not on file   Other Topics Concern  . Not on file   Social History Narrative   Regular exercise at gym.    ROS: no fevers or chills, productive cough, hemoptysis, dysphasia, odynophagia, melena, hematochezia, dysuria, hematuria, rash, seizure activity, orthopnea,  PND, pedal edema, claudication. Remaining systems are negative.  Physical Exam: Well-developed well-nourished in no acute distress.  Skin is warm and dry.  HEENT is normal.  Neck is supple.  Chest is clear to auscultation with normal expansion.  Cardiovascular exam is regular rate and rhythm.  Abdominal exam nontender or distended. No masses palpated. Extremities show no edema. neuro grossly intact  ECG Sinus rhythm at a rate of 79. No ST changes to

## 2013-11-27 NOTE — Assessment & Plan Note (Signed)
Blood pressure mildly elevated. Increase lisinopril to 40 mg daily.

## 2013-11-27 NOTE — Patient Instructions (Signed)
Your physician wants you to follow-up in: [place] will receive a reminder letter in the mail two months in advance. If you don't receive a letter, please call our office to schedule the follow-up appointment.   INCREASE LISINOPRIL 40 MG ONCE DAILY

## 2013-11-27 NOTE — Assessment & Plan Note (Signed)
Continue aspirin and statin. 

## 2014-01-14 ENCOUNTER — Telehealth: Payer: Self-pay | Admitting: Family Medicine

## 2014-01-14 DIAGNOSIS — Z0000 Encounter for general adult medical examination without abnormal findings: Secondary | ICD-10-CM

## 2014-01-14 DIAGNOSIS — Z125 Encounter for screening for malignant neoplasm of prostate: Secondary | ICD-10-CM

## 2014-01-14 NOTE — Telephone Encounter (Signed)
-----   Message from Ellamae Sia sent at 01/11/2014 12:35 PM EST ----- Regarding: Lab orders for 11.23.15 Patient is scheduled for CPX labs, please order future labs, Thanks , Karna Christmas

## 2014-01-15 ENCOUNTER — Other Ambulatory Visit (INDEPENDENT_AMBULATORY_CARE_PROVIDER_SITE_OTHER): Payer: BC Managed Care – PPO

## 2014-01-15 DIAGNOSIS — Z125 Encounter for screening for malignant neoplasm of prostate: Secondary | ICD-10-CM

## 2014-01-15 DIAGNOSIS — Z0000 Encounter for general adult medical examination without abnormal findings: Secondary | ICD-10-CM

## 2014-01-15 LAB — CBC WITH DIFFERENTIAL/PLATELET
BASOS PCT: 0.4 % (ref 0.0–3.0)
Basophils Absolute: 0 10*3/uL (ref 0.0–0.1)
EOS ABS: 0.3 10*3/uL (ref 0.0–0.7)
Eosinophils Relative: 2.7 % (ref 0.0–5.0)
HCT: 42.2 % (ref 39.0–52.0)
HEMOGLOBIN: 13.8 g/dL (ref 13.0–17.0)
Lymphocytes Relative: 20.4 % (ref 12.0–46.0)
Lymphs Abs: 2 10*3/uL (ref 0.7–4.0)
MCHC: 32.8 g/dL (ref 30.0–36.0)
MCV: 93.5 fl (ref 78.0–100.0)
Monocytes Absolute: 0.8 10*3/uL (ref 0.1–1.0)
Monocytes Relative: 8.6 % (ref 3.0–12.0)
NEUTROS ABS: 6.5 10*3/uL (ref 1.4–7.7)
Neutrophils Relative %: 67.9 % (ref 43.0–77.0)
Platelets: 232 10*3/uL (ref 150.0–400.0)
RBC: 4.52 Mil/uL (ref 4.22–5.81)
RDW: 12.8 % (ref 11.5–15.5)
WBC: 9.6 10*3/uL (ref 4.0–10.5)

## 2014-01-15 LAB — COMPREHENSIVE METABOLIC PANEL
ALT: 38 U/L (ref 0–53)
AST: 29 U/L (ref 0–37)
Albumin: 4.4 g/dL (ref 3.5–5.2)
Alkaline Phosphatase: 43 U/L (ref 39–117)
BUN: 20 mg/dL (ref 6–23)
CALCIUM: 9.6 mg/dL (ref 8.4–10.5)
CO2: 26 meq/L (ref 19–32)
CREATININE: 1.1 mg/dL (ref 0.4–1.5)
Chloride: 102 mEq/L (ref 96–112)
GFR: 74.41 mL/min (ref >60.00)
Glucose, Bld: 103 mg/dL — ABNORMAL HIGH (ref 70–99)
Potassium: 4.5 mEq/L (ref 3.5–5.1)
Sodium: 138 mEq/L (ref 135–145)
Total Bilirubin: 0.9 mg/dL (ref 0.2–1.2)
Total Protein: 6.9 g/dL (ref 6.0–8.3)

## 2014-01-15 LAB — PSA: PSA: 1.79 ng/mL (ref 0.10–4.00)

## 2014-01-15 LAB — TSH: TSH: 1.39 u[IU]/mL (ref 0.35–4.50)

## 2014-01-15 LAB — LIPID PANEL
CHOLESTEROL: 146 mg/dL (ref 0–200)
HDL: 45.8 mg/dL (ref >39.00)
LDL Cholesterol: 68 mg/dL (ref 0–99)
NonHDL: 100.2
TRIGLYCERIDES: 160 mg/dL — AB (ref 0.0–149.0)
Total CHOL/HDL Ratio: 3
VLDL: 32 mg/dL (ref 0.0–40.0)

## 2014-01-22 ENCOUNTER — Ambulatory Visit (INDEPENDENT_AMBULATORY_CARE_PROVIDER_SITE_OTHER): Payer: BC Managed Care – PPO | Admitting: Family Medicine

## 2014-01-22 ENCOUNTER — Encounter: Payer: Self-pay | Admitting: Family Medicine

## 2014-01-22 VITALS — BP 124/76 | HR 63 | Temp 98.3°F | Ht 70.5 in | Wt 243.8 lb

## 2014-01-22 DIAGNOSIS — Z23 Encounter for immunization: Secondary | ICD-10-CM

## 2014-01-22 DIAGNOSIS — Z0000 Encounter for general adult medical examination without abnormal findings: Secondary | ICD-10-CM

## 2014-01-22 DIAGNOSIS — E7800 Pure hypercholesterolemia, unspecified: Secondary | ICD-10-CM

## 2014-01-22 DIAGNOSIS — Z125 Encounter for screening for malignant neoplasm of prostate: Secondary | ICD-10-CM

## 2014-01-22 DIAGNOSIS — I10 Essential (primary) hypertension: Secondary | ICD-10-CM

## 2014-01-22 MED ORDER — ROSUVASTATIN CALCIUM 40 MG PO TABS: 40.0000 mg | ORAL_TABLET | Freq: Every day | ORAL | Status: DC

## 2014-01-22 NOTE — Progress Notes (Signed)
Pre visit review using our clinic review tool, if applicable. No additional management support is needed unless otherwise documented below in the visit note. 

## 2014-01-22 NOTE — Assessment & Plan Note (Signed)
Reviewed health habits including diet and exercise and skin cancer prevention Reviewed appropriate screening tests for age  Also reviewed health mt list, fam hx and immunization status , as well as social and family history   See HPI Labs rev  Disc strategy for wt loss Update pneumovax today

## 2014-01-22 NOTE — Assessment & Plan Note (Signed)
Disc goals for lipids and reasons to control them Rev labs with pt Rev low sat fat diet in detail Improved with inc in HDL Refilled crestor

## 2014-01-22 NOTE — Progress Notes (Signed)
Subjective:    Patient ID: Richard Baldwin, male    DOB: Apr 08, 1955, 58 y.o.   MRN: [id number]  HPI Here for health maintenance exam and to review chronic medical problems    Doing ok overall   Wt is down 4 lb with bmi of 34 Is eating less and less trying to loose weight  Walks 3 miles daily almost every day  Otherwise he feels good   More cracking and popping in his joints   New job - not as good as he thought it would be  A lot of traveling even though he did not expect it    Flu vaccine - had it at [facility] a mo ago  Had last pneumovax 06    Td 11/09  Colonoscopy 7/14 nl   bp is stable today  No cp or palpitations or headaches or edema  No side effects to medicines  BP Readings from Last 3 Encounters:  01/22/14 124/76  11/27/13 140/70  01/20/13 148/88     Hx of CAD=- stable   Hyperlipidemia  Lab Results  Component Value Date   CHOL 146 01/15/2014   CHOL 123 01/13/2013   CHOL 136 04/14/2012   Lab Results  Component Value Date   HDL 45.80 01/15/2014   HDL 34.80* 01/13/2013   HDL 46.40 04/14/2012   Lab Results  Component Value Date   LDLCALC 68 01/15/2014   LDLCALC 54 01/13/2013   LDLCALC 69 04/14/2012   Lab Results  Component Value Date   TRIG 160.0* 01/15/2014   TRIG 171.0* 01/13/2013   TRIG 103.0 04/14/2012   Lab Results  Component Value Date   CHOLHDL 3 01/15/2014   CHOLHDL 4 01/13/2013   CHOLHDL 3 04/14/2012   Lab Results  Component Value Date   LDLDIRECT 99.3 02/12/2012   LDLDIRECT 124.5 08/19/2009   LDLDIRECT 105.3 03/04/2009    On crestor and diet  He tolerates the crestor  Overall improved due to increase in good cholesterol    Prostate screen Lab Results  Component Value Date   PSA 1.79 01/15/2014   PSA 1.83 01/13/2013   PSA 1.74 01/08/2012   family - hx of prostate cancer in P uncle -in his 32s  No problems with flow or frequency and no nocturia    Patient Active Problem List   Diagnosis Date Noted  . Blood in  stool 08/29/2012  . Abdominal bloating 07/29/2012  . Diarrhea 07/15/2012  . Routine general medical examination at a health care facility 12/08/2010  . Prostate cancer screening 12/08/2010  . CAD (coronary artery disease)   . UNSTABLE ANGINA 04/07/2010  . ABNORMAL EKG 03/04/2010  . CHEST PAIN 02/28/2010  . COUGH 01/27/2010  . HYPERCHOLESTEROLEMIA 05/28/2006  . Essential hypertension 05/28/2006  . HEMORRHOIDS, WITH BLEEDING 05/28/2006  . GERD 05/28/2006  . TOBACCO USE, QUIT 05/28/2006   Past Medical History  Diagnosis Date  . CAD (coronary artery disease)     s/p Promus DES x 2 RCA 04/08/10;  Cath 2/12: mLAD 30%; CFX mild plaque; mRCA 99% then ulc. 80% tx'd with PCI, dRCA 30%; oPLB 90% (L-R collat); EF 55%  . Abnormal nuclear cardiac imaging test 04/07/10    EF 48%; inferior scar; mild-moderate peri-infarct ischemia  . Hypertension   . Hyperlipidemia   . GERD (gastroesophageal reflux disease)   . Colon polyps   . Hemorrhoids    Past Surgical History  Procedure Laterality Date  . Tonsillectomy    . Colonoscopy w/ polypectomy  2010  . Left heart catheterization  04/09/10  . Ptca  04/09/10     with placement two overlapping drug-eluting stents in the mid right coronary artery.    History  Substance Use Topics  . Smoking status: Former Smoker    Types: Cigarettes    Quit date: 02/23/2005  . Smokeless tobacco: Never Used     Comment: Quit smoking 02/2005.  Marland Kitchen Alcohol Use: 2.4 oz/week    4 Cans of beer per week     Comment: occ   Family History  Problem Relation Age of Onset  . Heart attack Father   . Diabetes Father   . Heart disease Father     CAD  . Heart disease Other     Strong family hx of CAD  . Sudden death Cousin     ? Cardiac  . Kidney disease Maternal Aunt   . Kidney disease Maternal Uncle   . Kidney disease Paternal Aunt   . Prostate cancer Paternal Uncle   . Liver disease Paternal Uncle   . Kidney disease Paternal Uncle    Allergies  Allergen  Reactions  . Atorvastatin     REACTION: elevated liver functions  . Butenafine     REACTION: burned  . Varenicline Tartrate     REACTION: sleepy   Current Outpatient Prescriptions on File Prior to Visit  Medication Sig Dispense Refill  . Ascorbic Acid (VITAMIN C) 1000 MG tablet Take 1,000 mg by mouth daily.      Marland Kitchen aspirin 81 MG tablet Take 81 mg by mouth daily.      . fish oil-omega-3 fatty acids 1000 MG capsule Take 1 g by mouth daily.      Marland Kitchen lisinopril (PRINIVIL,ZESTRIL) 40 MG tablet Take 1 tablet (40 mg total) by mouth daily. 30 tablet 12  . metoprolol succinate (TOPROL-XL) 25 MG 24 hr tablet TAKE 1 TABLET (25 MG TOTAL) BY MOUTH DAILY. 30 tablet 5  . NITROSTAT 0.4 MG SL tablet PLACE 1 TABLET UNDER TONGUE EVERY 5 MINS FOR 3 DOSES AS NEEDED 25 tablet 2  . pantoprazole (PROTONIX) 40 MG tablet TAKE 1 TABLET (40 MG TOTAL) BY MOUTH DAILY. 30 tablet 5   No current facility-administered medications on file prior to visit.     Review of Systems    Review of Systems  Constitutional: Negative for fever, appetite change, fatigue and unexpected weight change.  Eyes: Negative for pain and visual disturbance.  Respiratory: Negative for cough and shortness of breath.   Cardiovascular: Negative for cp or palpitations    Gastrointestinal: Negative for nausea, diarrhea and constipation.  Genitourinary: Negative for urgency and frequency.  Skin: Negative for pallor or rash   Neurological: Negative for weakness, light-headedness, numbness and headaches.  Hematological: Negative for adenopathy. Does not bruise/bleed easily.  Psychiatric/Behavioral: Negative for dysphoric mood. The patient is not nervous/anxious.      Objective:   Physical Exam  Constitutional: He is oriented to person, place, and time. He appears well-developed and well-nourished. No distress.  obese and well appearing   HENT:  Head: Normocephalic and atraumatic.  Right Ear: External ear normal.  Left Ear: External ear  normal.  Nose: Nose normal.  Mouth/Throat: Oropharynx is clear and moist.  Eyes: Conjunctivae and EOM are normal. Pupils are equal, round, and reactive to light. Right eye exhibits no discharge. Left eye exhibits no discharge. No scleral icterus.  Neck: Normal range of motion. Neck supple. No JVD present. Carotid bruit is not present. No  thyromegaly present.  Cardiovascular: Normal rate, regular rhythm, normal heart sounds and intact distal pulses.  Exam reveals no gallop.   Pulmonary/Chest: Effort normal and breath sounds normal. No respiratory distress. He has no wheezes. He has no rales.  Abdominal: Soft. Bowel sounds are normal. He exhibits no distension, no abdominal bruit and no mass. There is no tenderness.  Genitourinary: Prostate normal. Prostate is not enlarged and not tender.  Musculoskeletal: Normal range of motion. He exhibits no edema or tenderness.  Lymphadenopathy:    He has no cervical adenopathy.  Neurological: He is alert and oriented to person, place, and time. He has normal reflexes. No cranial nerve deficit. He exhibits normal muscle tone. Coordination normal.  Skin: Skin is warm and dry. No rash noted. No erythema. No pallor.  Psychiatric: He has a normal mood and affect.          Assessment & Plan:   Problem List Items Addressed This Visit      Cardiovascular and Mediastinum   Essential hypertension - Primary    bp in fair control at this time  BP Readings from Last 1 Encounters:  01/22/14 124/76   No changes needed Disc lifstyle change with low sodium diet and exercise   Lab reviewed in detail     Relevant Medications      rosuvastatin (CRESTOR) tablet     Other   HYPERCHOLESTEROLEMIA    Disc goals for lipids and reasons to control them Rev labs with pt Rev low sat fat diet in detail Improved with inc in HDL Refilled crestor     Relevant Medications      rosuvastatin (CRESTOR) tablet   Prostate cancer screening    Nl DRE fam hx  Lab Results    Component Value Date   PSA 1.79 01/15/2014   PSA 1.83 01/13/2013   PSA 1.74 01/08/2012       Routine general medical examination at a health care facility    Reviewed health habits including diet and exercise and skin cancer prevention Reviewed appropriate screening tests for age  Also reviewed health mt list, fam hx and immunization status , as well as social and family history   See HPI Labs rev  Disc strategy for wt loss Update pneumovax today     Other Visit Diagnoses    Need for 23-polyvalent pneumococcal polysaccharide vaccine        Relevant Orders       Pneumococcal polysaccharide vaccine 23-valent greater than or equal to 2yo subcutaneous/IM (Completed)

## 2014-01-22 NOTE — Assessment & Plan Note (Signed)
bp in fair control at this time  BP Readings from Last 1 Encounters:  01/22/14 124/76   No changes needed Disc lifstyle change with low sodium diet and exercise   Lab reviewed in detail

## 2014-01-22 NOTE — Patient Instructions (Signed)
Pneumonia vaccine today  If you are interested in a shingles/zoster vaccine - call your insurance to check on coverage,( you should not get it within 1 month of other vaccines) , then call us for a prescription  for it to take to a pharmacy that gives the shot , or make a nurse visit to get it here depending on your coverage  (often this will not be paid for until age 56)  Keep working on healthy diet and exercise and weight loss

## 2014-01-22 NOTE — Assessment & Plan Note (Signed)
Nl DRE fam hx  Lab Results  Component Value Date   PSA 1.79 01/15/2014   PSA 1.83 01/13/2013   PSA 1.74 01/08/2012

## 2014-01-23 ENCOUNTER — Telehealth: Payer: Self-pay | Admitting: Family Medicine

## 2014-01-23 NOTE — Telephone Encounter (Signed)
emmi emailed °

## 2014-02-12 ENCOUNTER — Other Ambulatory Visit: Payer: Self-pay | Admitting: Family Medicine

## 2014-03-29 ENCOUNTER — Telehealth: Payer: Self-pay | Admitting: Cardiology

## 2014-03-29 NOTE — Telephone Encounter (Signed)
Close encounter 

## 2014-05-18 ENCOUNTER — Telehealth: Payer: Self-pay | Admitting: Cardiology

## 2014-05-18 MED ORDER — METOPROLOL SUCCINATE ER 25 MG PO TB24: ORAL_TABLET | ORAL | Status: DC

## 2014-05-18 MED ORDER — PANTOPRAZOLE SODIUM 40 MG PO TBEC: DELAYED_RELEASE_TABLET | ORAL | Status: DC

## 2014-05-18 NOTE — Telephone Encounter (Signed)
E-SENT PRESCRIPTION WIFE AWARE.

## 2014-05-18 NOTE — Telephone Encounter (Signed)
°  1. Which medications need to be refilled? Pantoprazole and Metoprolol(new prescriptions are needed)   2. Which pharmacy [facility] medication to be [facility] to?[facility] [place]  3. Do they need a 30 day or 90 day supply? 30 day  4. Would they like a call back once the medication has been sent to the pharmacy? Yes

## 2014-05-31 ENCOUNTER — Ambulatory Visit: Payer: Self-pay | Admitting: Cardiology

## 2014-06-22 ENCOUNTER — Ambulatory Visit (INDEPENDENT_AMBULATORY_CARE_PROVIDER_SITE_OTHER): Payer: BLUE CROSS/BLUE SHIELD | Admitting: Family Medicine

## 2014-06-22 ENCOUNTER — Encounter: Payer: Self-pay | Admitting: Family Medicine

## 2014-06-22 ENCOUNTER — Ambulatory Visit: Payer: Self-pay | Admitting: Family Medicine

## 2014-06-22 VITALS — BP 128/88 | HR 70 | Temp 97.8°F | Ht 70.5 in | Wt 245.2 lb

## 2014-06-22 DIAGNOSIS — M545 Low back pain, unspecified: Secondary | ICD-10-CM | POA: Insufficient documentation

## 2014-06-22 MED ORDER — MELOXICAM 15 MG PO TABS: 15.0000 mg | ORAL_TABLET | Freq: Every day | ORAL | Status: DC | PRN

## 2014-06-22 NOTE — Progress Notes (Signed)
Pre visit review using our clinic review tool, if applicable. No additional management support is needed unless otherwise documented below in the visit note. 

## 2014-06-22 NOTE — Progress Notes (Signed)
Subjective:    Patient ID: Richard Baldwin, male    DOB: 1955-07-18, 59 y.o.   MRN: [id number]  HPI Here with back pain   Mid month - was working out of town - had a low sink in the hotel bathroom and bent over too far Very painful to stand up immediately  Upper low back - does not radiate into either leg / no numbness or weakness in legs or feet   Tylenol Thermal-heat patches or asper creme  First week was bad  Earlier this week-improved but still quite bothersome (some days worse than others)  Getting out of bed-pain and stiffness   Different mattresses while traveling   Position - - worse with prolonged sitting (doing lumbar adjustments to his seat)  Ok when in bed    Patient Active Problem List   Diagnosis Date Noted  . Routine general medical examination at a health care facility 12/08/2010  . Prostate cancer screening 12/08/2010  . CAD (coronary artery disease)   . UNSTABLE ANGINA 04/07/2010  . ABNORMAL EKG 03/04/2010  . HYPERCHOLESTEROLEMIA 05/28/2006  . Essential hypertension 05/28/2006  . HEMORRHOIDS, WITH BLEEDING 05/28/2006  . GERD 05/28/2006  . TOBACCO USE, QUIT 05/28/2006   Past Medical History  Diagnosis Date  . CAD (coronary artery disease)     s/p Promus DES x 2 RCA 04/08/10;  Cath 2/12: mLAD 30%; CFX mild plaque; mRCA 99% then ulc. 80% tx'd with PCI, dRCA 30%; oPLB 90% (L-R collat); EF 55%  . Abnormal nuclear cardiac imaging test 04/07/10    EF 48%; inferior scar; mild-moderate peri-infarct ischemia  . Hypertension   . Hyperlipidemia   . GERD (gastroesophageal reflux disease)   . Colon polyps   . Hemorrhoids    Past Surgical History  Procedure Laterality Date  . Tonsillectomy    . Colonoscopy w/ polypectomy  2010  . Left heart catheterization  04/09/10  . Ptca  04/09/10     with placement two overlapping drug-eluting stents in the mid right coronary artery.    History  Substance Use Topics  . Smoking status: Former Smoker    Types:  Cigarettes    Quit date: 02/23/2005  . Smokeless tobacco: Never Used     Comment: Quit smoking 02/2005.  Marland Kitchen Alcohol Use: 2.4 oz/week    4 Cans of beer per week     Comment: occ   Family History  Problem Relation Age of Onset  . Heart attack Father   . Diabetes Father   . Heart disease Father     CAD  . Heart disease Other     Strong family hx of CAD  . Sudden death Cousin     ? Cardiac  . Kidney disease Maternal Aunt   . Kidney disease Maternal Uncle   . Kidney disease Paternal Aunt   . Prostate cancer Paternal Uncle   . Liver disease Paternal Uncle   . Kidney disease Paternal Uncle    Allergies  Allergen Reactions  . Atorvastatin     REACTION: elevated liver functions  . Butenafine     REACTION: burned  . Varenicline Tartrate     REACTION: sleepy   Current Outpatient Prescriptions on File Prior to Visit  Medication Sig Dispense Refill  . Ascorbic Acid (VITAMIN C) 1000 MG tablet Take 1,000 mg by mouth daily.      Marland Kitchen aspirin 81 MG tablet Take 81 mg by mouth daily.      . CRESTOR 40 MG tablet  TAKE 1 TABLET (40 MG TOTAL) BY MOUTH DAILY. 30 tablet 5  . fish oil-omega-3 fatty acids 1000 MG capsule Take 1 g by mouth daily.      Marland Kitchen lisinopril (PRINIVIL,ZESTRIL) 40 MG tablet Take 1 tablet (40 mg total) by mouth daily. 30 tablet 12  . metoprolol succinate (TOPROL-XL) 25 MG 24 hr tablet TAKE 1 TABLET (25 MG TOTAL) BY MOUTH DAILY. 30 tablet 6  . NITROSTAT 0.4 MG SL tablet PLACE 1 TABLET UNDER TONGUE EVERY 5 MINS FOR 3 DOSES AS NEEDED 25 tablet 2  . pantoprazole (PROTONIX) 40 MG tablet TAKE 1 TABLET (40 MG TOTAL) BY MOUTH DAILY. 30 tablet 6   No current facility-administered medications on file prior to visit.        Review of Systems Review of Systems  Constitutional: Negative for fever, appetite change, fatigue and unexpected weight change.  Eyes: Negative for pain and visual disturbance.  Respiratory: Negative for cough and shortness of breath.   Cardiovascular: Negative  for cp or palpitations    Gastrointestinal: Negative for nausea, diarrhea and constipation.  Genitourinary: Negative for urgency and frequency.  Skin: Negative for pallor or rash   MSK pos for low back pain  Neurological: Negative for weakness, light-headedness, numbness and headaches.  Hematological: Negative for adenopathy. Does not bruise/bleed easily.  Psychiatric/Behavioral: Negative for dysphoric mood. The patient is not nervous/anxious.         Objective:   Physical Exam  Constitutional: He appears well-developed and well-nourished.  obese and well appearing   Eyes: Conjunctivae and EOM are normal. Pupils are equal, round, and reactive to light.  Neck: Normal range of motion. Neck supple.  Cardiovascular: Normal rate and regular rhythm.   Pulmonary/Chest: Effort normal and breath sounds normal.  Abdominal: Soft. Bowel sounds are normal.  Musculoskeletal: He exhibits tenderness. He exhibits no edema.       Lumbar back: He exhibits decreased range of motion and tenderness. He exhibits no bony tenderness and no edema.  Tender lumbar musculature bilaterally  Flex 90 deg and ext 10 deg Pain with L lat bend  No bony tenderness Neg SLR Nl gait   No neuro changes   Neurological: He is alert. He has normal strength and normal reflexes. No sensory deficit.  Skin: Skin is warm and dry. No rash noted. No erythema.  Psychiatric: He has a normal mood and affect.          Assessment & Plan:   Problem List Items Addressed This Visit      Other   Low back pain - Primary    Lumbar strain-handout given on this and prev of back inj  Px meloxicam- enc stretching and use of heat  Re asssuring exam  Update if worse /no imp in 1-2 wk or if neuro symptoms       Relevant Medications   meloxicam (MOBIC) 15 MG tablet

## 2014-06-22 NOTE — Patient Instructions (Signed)
For back pain - meloxicam is a long acting anti inflammatory - it lasts all day and take if with food  Use heat when you can Keep stretching and walking as much as you can If pain worsens or you develop pain in legs or weakness please let me know

## 2014-06-24 NOTE — Assessment & Plan Note (Signed)
Lumbar strain-handout given on this and prev of back inj  Px meloxicam- enc stretching and use of heat  Re asssuring exam  Update if worse /no imp in 1-2 wk or if neuro symptoms

## 2014-11-06 ENCOUNTER — Other Ambulatory Visit: Payer: Self-pay

## 2014-11-06 NOTE — Telephone Encounter (Signed)
metoprolol succinate (TOPROL-XL) 25 MG 24 hr tablet 30 tablet 6 05/18/2014      Sig:  TAKE 1 TABLET (25 MG TOTAL) BY MOUTH DAILY.    Class:  Normal    DAW:  No    Authorizing Provider:  Lelon Perla, MD    Ordering User:  Raiford Simmonds, RN

## 2014-11-27 NOTE — Progress Notes (Signed)
HPI: fu CAD; admitted from the office on 04/07/10 with unstable angina. He had Myoview study that day that was abnormal with inferior scar and mild to moderate peri-Infarct ischemia. EF was 48%. Cardiac catheterization demonstrated 30% LAD stenosis and mild plaque in the circumflex. He had a mid 99% and then an ulcerated 80% stenosis in the RCA. This was treated with 2 drug-eluting stents (Promus). He also had a 90% ostial posterior lateral branch stenosis. There are left to right collaterals. EF 55%. Since I last saw him, he denies dyspnea, chest pain, palpitations or syncope.  Current Outpatient Prescriptions  Medication Sig Dispense Refill  . Ascorbic Acid (VITAMIN C) 1000 MG tablet Take 1,000 mg by mouth daily.      Marland Kitchen aspirin 81 MG tablet Take 81 mg by mouth daily.      . CRESTOR 40 MG tablet TAKE 1 TABLET (40 MG TOTAL) BY MOUTH DAILY. 30 tablet 5  . fish oil-omega-3 fatty acids 1000 MG capsule Take 1 g by mouth daily.      Marland Kitchen lisinopril (PRINIVIL,ZESTRIL) 40 MG tablet Take 1 tablet (40 mg total) by mouth daily. 30 tablet 12  . meloxicam (MOBIC) 15 MG tablet Take 1 tablet (15 mg total) by mouth daily as needed for pain (as needed for back pain, take with food). 30 tablet 1  . metoprolol succinate (TOPROL-XL) 25 MG 24 hr tablet TAKE 1 TABLET (25 MG TOTAL) BY MOUTH DAILY. 30 tablet 0  . NITROSTAT 0.4 MG SL tablet PLACE 1 TABLET UNDER TONGUE EVERY 5 MINS FOR 3 DOSES AS NEEDED 25 tablet 2  . pantoprazole (PROTONIX) 40 MG tablet TAKE 1 TABLET (40 MG TOTAL) BY MOUTH DAILY. 30 tablet 0   No current facility-administered medications for this visit.     Past Medical History  Diagnosis Date  . CAD (coronary artery disease)     s/p Promus DES x 2 RCA 04/08/10;  Cath 2/12: mLAD 30%; CFX mild plaque; mRCA 99% then ulc. 80% tx'd with PCI, dRCA 30%; oPLB 90% (L-R collat); EF 55%  . Abnormal nuclear cardiac imaging test 04/07/10    EF 48%; inferior scar; mild-moderate peri-infarct ischemia  .  Hypertension   . Hyperlipidemia   . GERD (gastroesophageal reflux disease)   . Colon polyps   . Hemorrhoids     Past Surgical History  Procedure Laterality Date  . Tonsillectomy    . Colonoscopy w/ polypectomy  2010  . Left heart catheterization  04/09/10  . Ptca  04/09/10     with placement two overlapping drug-eluting stents in the mid right coronary artery.     Social History   Social History  . Marital Status: Married    Spouse Name: N/A  . Number of Children: N/A  . Years of Education: N/A   Occupational History  . Not on file.   Social History Main Topics  . Smoking status: Former Smoker    Types: Cigarettes    Quit date: 02/23/2005  . Smokeless tobacco: Never Used     Comment: Quit smoking 02/2005.  Marland Kitchen Alcohol Use: 2.4 oz/week    4 Cans of beer per week     Comment: occ  . Drug Use: No  . Sexual Activity: Not on file   Other Topics Concern  . Not on file   Social History Narrative   Regular exercise at gym.    ROS: no fevers or chills, productive cough, hemoptysis, dysphasia, odynophagia, melena, hematochezia, dysuria, hematuria,  rash, seizure activity, orthopnea, PND, pedal edema, claudication. Remaining systems are negative.  Physical Exam: Well-developed well-nourished in no acute distress.  Skin is warm and dry.  HEENT is normal.  Neck is supple.  Chest is clear to auscultation with normal expansion.  Cardiovascular exam is regular rate and rhythm.  Abdominal exam nontender or distended. No masses palpated. Extremities show no edema. neuro grossly intact  ECG Sinus rhythm at a rate of 86. No ST changes.

## 2014-11-30 ENCOUNTER — Other Ambulatory Visit: Payer: Self-pay

## 2014-11-30 MED ORDER — METOPROLOL SUCCINATE ER 25 MG PO TB24: ORAL_TABLET | ORAL | Status: DC

## 2014-11-30 MED ORDER — PANTOPRAZOLE SODIUM 40 MG PO TBEC: DELAYED_RELEASE_TABLET | ORAL | Status: DC

## 2014-12-03 ENCOUNTER — Ambulatory Visit (INDEPENDENT_AMBULATORY_CARE_PROVIDER_SITE_OTHER): Payer: BLUE CROSS/BLUE SHIELD | Admitting: Cardiology

## 2014-12-03 ENCOUNTER — Encounter: Payer: Self-pay | Admitting: *Deleted

## 2014-12-03 ENCOUNTER — Encounter: Payer: Self-pay | Admitting: Cardiology

## 2014-12-03 VITALS — BP 148/92 | HR 86 | Ht 72.0 in | Wt 248.0 lb

## 2014-12-03 DIAGNOSIS — I10 Essential (primary) hypertension: Secondary | ICD-10-CM | POA: Diagnosis not present

## 2014-12-03 DIAGNOSIS — I25810 Atherosclerosis of coronary artery bypass graft(s) without angina pectoris: Secondary | ICD-10-CM

## 2014-12-03 NOTE — Assessment & Plan Note (Signed)
Blood pressure is mildly elevated but he does follow this at home and is typically controlled. Continue present medications and follow. Potassium and renal function monitored by primary care.

## 2014-12-03 NOTE — Patient Instructions (Signed)
Medication Instructions:   NO CHANGE  Testing/Procedures:  Your physician has requested that you have en exercise stress myoview. For further information please visit [email or web address]. Please follow instruction sheet, as given.  TAKE METOPROLOL PER MD  Follow-Up:  Your physician wants you to follow-up in: [place] will receive a reminder letter in the mail two months in advance. If you don't receive a letter, please call our office to schedule the follow-up appointment.

## 2014-12-03 NOTE — Assessment & Plan Note (Signed)
Continue aspirin and statin. Schedule nuclear study for risk stratification. Patient has not exercised as much recently. If his nuclear study is normal I have asked him to resume his exercise.

## 2014-12-03 NOTE — Assessment & Plan Note (Signed)
Continue statin. Lipids and liver monitored by primary care. 

## 2014-12-20 ENCOUNTER — Other Ambulatory Visit: Payer: Self-pay | Admitting: Cardiology

## 2014-12-20 ENCOUNTER — Telehealth: Payer: Self-pay | Admitting: Cardiology

## 2014-12-21 NOTE — Telephone Encounter (Signed)
Close encounter 

## 2014-12-25 ENCOUNTER — Inpatient Hospital Stay (HOSPITAL_COMMUNITY): Admission: RE | Admit: 2014-12-25 | Payer: BLUE CROSS/BLUE SHIELD | Source: Ambulatory Visit

## 2014-12-27 ENCOUNTER — Other Ambulatory Visit: Payer: Self-pay | Admitting: Cardiology

## 2014-12-27 MED ORDER — PANTOPRAZOLE SODIUM 40 MG PO TBEC: DELAYED_RELEASE_TABLET | ORAL | Status: DC

## 2014-12-27 MED ORDER — METOPROLOL SUCCINATE ER 25 MG PO TB24: ORAL_TABLET | ORAL | Status: DC

## 2015-01-14 ENCOUNTER — Telehealth: Payer: Self-pay | Admitting: Family Medicine

## 2015-01-14 ENCOUNTER — Other Ambulatory Visit: Payer: BC Managed Care – PPO

## 2015-01-14 DIAGNOSIS — Z0000 Encounter for general adult medical examination without abnormal findings: Secondary | ICD-10-CM

## 2015-01-14 DIAGNOSIS — Z125 Encounter for screening for malignant neoplasm of prostate: Secondary | ICD-10-CM

## 2015-01-14 NOTE — Telephone Encounter (Signed)
-----   Message from Ellamae Sia sent at 01/08/2015  4:16 PM EST ----- Regarding: Lab orders for Tuesdday, 11.22.16 Patient is scheduled for CPX labs, please order future labs, Thanks , Karna Christmas

## 2015-01-15 ENCOUNTER — Other Ambulatory Visit (INDEPENDENT_AMBULATORY_CARE_PROVIDER_SITE_OTHER): Payer: BLUE CROSS/BLUE SHIELD

## 2015-01-15 DIAGNOSIS — Z0000 Encounter for general adult medical examination without abnormal findings: Secondary | ICD-10-CM | POA: Diagnosis not present

## 2015-01-15 DIAGNOSIS — Z125 Encounter for screening for malignant neoplasm of prostate: Secondary | ICD-10-CM | POA: Diagnosis not present

## 2015-01-15 LAB — CBC WITH DIFFERENTIAL/PLATELET
Basophils Absolute: 0 10*3/uL (ref 0.0–0.1)
Basophils Relative: 0.4 % (ref 0.0–3.0)
Eosinophils Absolute: 0.2 10*3/uL (ref 0.0–0.7)
Eosinophils Relative: 2.6 % (ref 0.0–5.0)
HCT: 42.5 % (ref 39.0–52.0)
HEMOGLOBIN: 14.2 g/dL (ref 13.0–17.0)
LYMPHS ABS: 2.1 10*3/uL (ref 0.7–4.0)
Lymphocytes Relative: 23.7 % (ref 12.0–46.0)
MCHC: 33.5 g/dL (ref 30.0–36.0)
MCV: 92.1 fl (ref 78.0–100.0)
MONO ABS: 0.9 10*3/uL (ref 0.1–1.0)
Monocytes Relative: 9.9 % (ref 3.0–12.0)
Neutro Abs: 5.6 10*3/uL (ref 1.4–7.7)
Neutrophils Relative %: 63.4 % (ref 43.0–77.0)
Platelets: 221 10*3/uL (ref 150.0–400.0)
RBC: 4.62 Mil/uL (ref 4.22–5.81)
RDW: 13.1 % (ref 11.5–15.5)
WBC: 8.8 10*3/uL (ref 4.0–10.5)

## 2015-01-15 LAB — COMPREHENSIVE METABOLIC PANEL
ALBUMIN: 4.5 g/dL (ref 3.5–5.2)
ALK PHOS: 46 U/L (ref 39–117)
ALT: 23 U/L (ref 0–53)
AST: 18 U/L (ref 0–37)
BUN: 22 mg/dL (ref 6–23)
CHLORIDE: 101 meq/L (ref 96–112)
CO2: 28 mEq/L (ref 19–32)
Calcium: 9.9 mg/dL (ref 8.4–10.5)
Creatinine, Ser: 1.07 mg/dL (ref 0.40–1.50)
GFR: 75.74 mL/min (ref >60.00)
Glucose, Bld: 103 mg/dL — ABNORMAL HIGH (ref 70–99)
POTASSIUM: 4.5 meq/L (ref 3.5–5.1)
SODIUM: 138 meq/L (ref 135–145)
TOTAL PROTEIN: 6.8 g/dL (ref 6.0–8.3)
Total Bilirubin: 0.7 mg/dL (ref 0.2–1.2)

## 2015-01-15 LAB — PSA: PSA: 1.81 ng/mL (ref 0.10–4.00)

## 2015-01-15 LAB — LIPID PANEL
CHOLESTEROL: 155 mg/dL (ref 0–200)
HDL: 45.7 mg/dL (ref >39.00)
LDL CALC: 82 mg/dL (ref 0–99)
NonHDL: 109.48
TRIGLYCERIDES: 138 mg/dL (ref 0.0–149.0)
Total CHOL/HDL Ratio: 3
VLDL: 27.6 mg/dL (ref 0.0–40.0)

## 2015-01-15 LAB — TSH: TSH: 0.91 u[IU]/mL (ref 0.35–4.50)

## 2015-01-16 ENCOUNTER — Telehealth (HOSPITAL_COMMUNITY): Payer: Self-pay

## 2015-01-16 NOTE — Telephone Encounter (Signed)
Encounter complete. 

## 2015-01-23 ENCOUNTER — Ambulatory Visit (HOSPITAL_COMMUNITY)
Admission: RE | Admit: 2015-01-23 | Discharge: 2015-01-23 | Disposition: A | Discharge status: Home / Self Care | Payer: BLUE CROSS/BLUE SHIELD | Source: Ambulatory Visit | Attending: Internal Medicine | Admitting: Internal Medicine

## 2015-01-23 DIAGNOSIS — I10 Essential (primary) hypertension: Secondary | ICD-10-CM | POA: Diagnosis not present

## 2015-01-23 DIAGNOSIS — R9439 Abnormal result of other cardiovascular function study: Secondary | ICD-10-CM | POA: Insufficient documentation

## 2015-01-23 DIAGNOSIS — Z87891 Personal history of nicotine dependence: Secondary | ICD-10-CM | POA: Diagnosis not present

## 2015-01-23 DIAGNOSIS — E669 Obesity, unspecified: Secondary | ICD-10-CM | POA: Insufficient documentation

## 2015-01-23 DIAGNOSIS — Z6833 Body mass index (BMI) 33.0-33.9, adult: Secondary | ICD-10-CM | POA: Diagnosis not present

## 2015-01-23 DIAGNOSIS — I25810 Atherosclerosis of coronary artery bypass graft(s) without angina pectoris: Secondary | ICD-10-CM

## 2015-01-23 DIAGNOSIS — Z8249 Family history of ischemic heart disease and other diseases of the circulatory system: Secondary | ICD-10-CM | POA: Diagnosis not present

## 2015-01-23 LAB — MYOCARDIAL PERFUSION IMAGING
CHL CUP MPHR: 164 {beats}/min
CHL CUP NUCLEAR SDS: 0
CHL CUP NUCLEAR SRS: 3
CHL CUP RESTING HR STRESS: 65 {beats}/min
CHL RATE OF PERCEIVED EXERTION: 17
CSEPED: 10 min
CSEPEDS: 0 s
CSEPEW: 11.1 METS
LV dias vol: 117 mL
LV sys vol: 50 mL
Peak HR: 160 {beats}/min
Percent HR: 97 %
SSS: 3
TID: 1

## 2015-01-23 MED ORDER — TECHNETIUM TC 99M SESTAMIBI GENERIC - CARDIOLITE
10.9000 | Freq: Once | INTRAVENOUS | Status: AC | PRN
  Administered 2015-01-23: 10.9 via INTRAVENOUS

## 2015-01-23 MED ORDER — TECHNETIUM TC 99M SESTAMIBI GENERIC - CARDIOLITE
32.3000 | Freq: Once | INTRAVENOUS | Status: AC | PRN
  Administered 2015-01-23: 32.3 via INTRAVENOUS

## 2015-01-25 ENCOUNTER — Encounter: Payer: Self-pay | Admitting: Family Medicine

## 2015-01-25 ENCOUNTER — Ambulatory Visit (INDEPENDENT_AMBULATORY_CARE_PROVIDER_SITE_OTHER): Payer: BLUE CROSS/BLUE SHIELD | Admitting: Family Medicine

## 2015-01-25 ENCOUNTER — Telehealth: Payer: Self-pay | Admitting: Family Medicine

## 2015-01-25 VITALS — BP 136/82 | HR 71 | Temp 97.8°F | Ht 70.25 in | Wt 250.5 lb

## 2015-01-25 DIAGNOSIS — Z125 Encounter for screening for malignant neoplasm of prostate: Secondary | ICD-10-CM | POA: Diagnosis not present

## 2015-01-25 DIAGNOSIS — Z1211 Encounter for screening for malignant neoplasm of colon: Secondary | ICD-10-CM | POA: Insufficient documentation

## 2015-01-25 DIAGNOSIS — E7800 Pure hypercholesterolemia, unspecified: Secondary | ICD-10-CM | POA: Diagnosis not present

## 2015-01-25 DIAGNOSIS — I10 Essential (primary) hypertension: Secondary | ICD-10-CM | POA: Diagnosis not present

## 2015-01-25 DIAGNOSIS — Z0000 Encounter for general adult medical examination without abnormal findings: Secondary | ICD-10-CM | POA: Diagnosis not present

## 2015-01-25 NOTE — Progress Notes (Signed)
Pre visit review using our clinic review tool, if applicable. No additional management support is needed unless otherwise documented below in the visit note. 

## 2015-01-25 NOTE — Patient Instructions (Addendum)
See your dermatologist as planned - look at the spots on your legs - (you can use some cort aid over the counter) Please do stool kit for colon cancer screening  Work in exercise when you can fit it in  Keep eating smart - and keep portions limited for weight loss

## 2015-01-25 NOTE — Progress Notes (Signed)
Subjective:    Patient ID: Richard Baldwin, male    DOB: March 02, 1955, 59 y.o.   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 - very hard to fit in   Does not eat that much   Had a stress test on Wed - that went well    Wt is up 2 lb with bmi of 35  (up 7 lb in the past year) Exercise has not been as good - less walking   Hep C/HIV screen-not high risk   Flu shot 10/16   Td 11/09   Flex sign 7/14 -colitis  No longer on med    Prostate screen Lab Results  Component Value Date   PSA 1.81 01/15/2015   PSA 1.79 01/15/2014   PSA 1.83 01/13/2013   no problems with urination  Nocturia usually none - occ once  Paternal uncle had prostate cancer  No other prostate ca in family    bp is stable today  No cp or palpitations or headaches or edema  No side effects to medicines  BP Readings from Last 3 Encounters:  01/25/15 136/82  12/03/14 148/92  06/22/14 128/88     CAD-stable   Cholesterol Lab Results  Component Value Date   CHOL 155 01/15/2015   CHOL 146 01/15/2014   CHOL 123 01/13/2013   Lab Results  Component Value Date   HDL 45.70 01/15/2015   HDL 45.80 01/15/2014   HDL 34.80* 01/13/2013   Lab Results  Component Value Date   LDLCALC 82 01/15/2015   LDLCALC 68 01/15/2014   LDLCALC 54 01/13/2013   Lab Results  Component Value Date   TRIG 138.0 01/15/2015   TRIG 160.0* 01/15/2014   TRIG 171.0* 01/13/2013   Lab Results  Component Value Date   CHOLHDL 3 01/15/2015   CHOLHDL 3 01/15/2014   CHOLHDL 4 01/13/2013   Lab Results  Component Value Date   LDLDIRECT 99.3 02/12/2012   LDLDIRECT 124.5 08/19/2009   LDLDIRECT 105.3 03/04/2009   good  eating  Stable  On crestor -is working (generic)   Patient Active Problem List   Diagnosis Date Noted  . Low back pain 06/22/2014  . Routine general medical examination at a health care facility 12/08/2010  . Prostate cancer screening 12/08/2010  . CAD (coronary artery disease)   . UNSTABLE ANGINA 04/07/2010  . ABNORMAL EKG 03/04/2010  . HYPERCHOLESTEROLEMIA 05/28/2006  . Essential hypertension 05/28/2006  . HEMORRHOIDS, WITH BLEEDING 05/28/2006  . GERD 05/28/2006  . TOBACCO USE, QUIT 05/28/2006   Past Medical History  Diagnosis Date  . CAD (coronary artery disease)     s/p Promus DES x 2 RCA 04/08/10;  Cath 2/12: mLAD 30%; CFX mild plaque; mRCA 99% then ulc. 80% tx'd with PCI, dRCA 30%; oPLB 90% (L-R collat); EF 55%  . Abnormal nuclear cardiac imaging test 04/07/10    EF 48%; inferior scar; mild-moderate peri-infarct ischemia  . Hypertension   . Hyperlipidemia   . GERD (gastroesophageal reflux disease)   . Colon polyps   . Hemorrhoids    Past Surgical History  Procedure Laterality Date  .  Tonsillectomy    . Colonoscopy w/ polypectomy  2010  . Left heart catheterization  04/09/10  . Ptca  04/09/10     with placement two overlapping drug-eluting stents in the mid right coronary artery.    Social History  Substance Use Topics  . Smoking status: Former Smoker    Types: Cigarettes    Quit date: 02/23/2005  . Smokeless tobacco: Never Used     Comment: Quit smoking 02/2005.  Marland Kitchen Alcohol Use: 2.4 oz/week    4 Cans of beer per week     Comment: occ   Family History  Problem Relation Age of Onset  . Heart attack Father   . Diabetes Father   . Heart disease Father     CAD  . Heart disease Other     Strong family hx of CAD  . Sudden death Cousin     ? Cardiac  . Kidney disease Maternal Aunt   . Kidney disease Maternal Uncle   . Kidney disease Paternal Aunt   . Prostate cancer Paternal Uncle   . Liver disease Paternal Uncle   . Kidney disease Paternal Uncle     Allergies  Allergen Reactions  . Atorvastatin     REACTION: elevated liver functions  . Butenafine     REACTION: burned  . Varenicline Tartrate     REACTION: sleepy   Current Outpatient Prescriptions on File Prior to Visit  Medication Sig Dispense Refill  . Ascorbic Acid (VITAMIN C) 1000 MG tablet Take 1,000 mg by mouth daily.      Marland Kitchen aspirin 81 MG tablet Take 81 mg by mouth daily.      . CRESTOR 40 MG tablet TAKE 1 TABLET (40 MG TOTAL) BY MOUTH DAILY. 30 tablet 5  . fish oil-omega-3 fatty acids 1000 MG capsule Take 1 g by mouth daily.      Marland Kitchen lisinopril (PRINIVIL,ZESTRIL) 40 MG tablet TAKE 1 TABLET (40 MG TOTAL) BY MOUTH DAILY. 30 tablet 11  . meloxicam (MOBIC) 15 MG tablet Take 1 tablet (15 mg total) by mouth daily as needed for pain (as needed for back pain, take with food). 30 tablet 1  . metoprolol succinate (TOPROL-XL) 25 MG 24 hr tablet TAKE 1 TABLET (25 MG TOTAL) BY MOUTH DAILY. 30 tablet 11  . NITROSTAT 0.4 MG SL tablet PLACE 1 TABLET UNDER TONGUE EVERY 5 MINS FOR 3 DOSES AS NEEDED 25 tablet 2  . pantoprazole (PROTONIX) 40 MG tablet TAKE 1 TABLET (40 MG TOTAL) BY MOUTH DAILY. 30 tablet 11   No current facility-administered medications on file prior to visit.      Review of Systems Review of Systems  Constitutional: Negative for fever, appetite change, fatigue and unexpected weight change.  Eyes: Negative for pain and visual disturbance.  Respiratory: Negative for cough and shortness of breath.   Cardiovascular: Negative for cp or palpitations    Gastrointestinal: Negative for nausea, diarrhea and constipation.  Genitourinary: Negative for urgency and frequency.  Skin: Negative for pallor or rash  pos for some skin lesions on legs  Neurological: Negative for weakness, light-headedness, numbness and headaches.  Hematological: Negative for adenopathy. Does not bruise/bleed easily.  Psychiatric/Behavioral: Negative for dysphoric mood. The patient is not nervous/anxious.          Objective:   Physical Exam  Constitutional: He appears well-developed and well-nourished. No distress.  obese and well appearing   HENT:  Head: Normocephalic and atraumatic.  Right Ear: External ear normal.  Left Ear:  External ear normal.  Nose: Nose normal.  Mouth/Throat: Oropharynx is clear and moist.  Eyes: Conjunctivae and EOM are normal. Pupils are equal, round, and reactive to light. Right eye exhibits no discharge. Left eye exhibits no discharge. No scleral icterus.  Neck: Normal range of motion. Neck supple. No JVD present. Carotid bruit is not present. No thyromegaly present.  Cardiovascular: Normal rate, regular rhythm, normal heart sounds and intact distal pulses.  Exam reveals no gallop.   Pulmonary/Chest: Effort normal and breath sounds normal. No respiratory distress. He has no wheezes. He exhibits no tenderness.  Abdominal: Soft. Bowel sounds are normal. He exhibits no distension, no abdominal bruit and no mass. There is no tenderness.  Musculoskeletal: He exhibits no edema or tenderness.  Lymphadenopathy:    He has no cervical adenopathy.  Neurological: He is alert. He has normal reflexes. No cranial nerve deficit. He exhibits normal muscle tone. Coordination normal.  Skin: Skin is warm and dry. No rash noted. No erythema. No pallor.  Some small pink scabs on different areas of legs - w/o central clearing or skin breakdown  No excoriation  Different ages- some almost healed  Psychiatric: He has a normal mood and affect.          Assessment & Plan:   Problem List Items Addressed This Visit      Cardiovascular and Mediastinum   Essential hypertension    bp in fair control at this time  BP Readings from Last 1 Encounters:  01/25/15 136/82   No changes needed Disc lifstyle change with low sodium diet and exercise  Labs reviewed         Other   Colon cancer screening    IFOB stool kit given  Had colitis on past flex sig in 2014  No longer has  symptoms       Relevant Orders   Fecal occult blood, imunochemical   HYPERCHOLESTEROLEMIA    Disc goals for lipids and reasons to control them in pt with known CAD  Rev labs with pt Rev low sat fat diet in detail  Well controlled with crestor       Prostate cancer screening    No change in nocturia/no symptoms Lab Results  Component Value Date   PSA 1.81 01/15/2015   PSA 1.79 01/15/2014   PSA 1.83 01/13/2013   Reassuring       Routine general medical examination at a health care facility - Primary    Reviewed health habits including diet and exercise and skin cancer prevention Reviewed appropriate screening tests for age  Also reviewed health mt list, fam hx and immunization status , as well as social and family history   See HPI Labs reviewed  Enc wt loss and fitness  See your dermatologist as planned - look at the spots on your legs - (you can use some cort aid over the counter) Please do stool kit for colon cancer screening  Work in exercise when you can fit it in  Keep eating smart - and keep portions limited for weight loss

## 2015-01-27 NOTE — Assessment & Plan Note (Signed)
IFOB stool kit given  Had colitis on past flex sig in 2014  No longer has symptoms

## 2015-01-27 NOTE — Assessment & Plan Note (Signed)
Disc goals for lipids and reasons to control them in pt with known CAD  Rev labs with pt Rev low sat fat diet in detail  Well controlled with crestor

## 2015-01-27 NOTE — Assessment & Plan Note (Signed)
bp in fair control at this time  BP Readings from Last 1 Encounters:  01/25/15 136/82   No changes needed Disc lifstyle change with low sodium diet and exercise  Labs reviewed

## 2015-01-27 NOTE — Assessment & Plan Note (Signed)
No change in nocturia/no symptoms Lab Results  Component Value Date   PSA 1.81 01/15/2015   PSA 1.79 01/15/2014   PSA 1.83 01/13/2013   Reassuring

## 2015-01-27 NOTE — Assessment & Plan Note (Signed)
Reviewed health habits including diet and exercise and skin cancer prevention Reviewed appropriate screening tests for age  Also reviewed health mt list, fam hx and immunization status , as well as social and family history   See HPI Labs reviewed  Enc wt loss and fitness  See your dermatologist as planned - look at the spots on your legs - (you can use some cort aid over the counter) Please do stool kit for colon cancer screening  Work in exercise when you can fit it in  Keep eating smart - and keep portions limited for weight loss

## 2015-01-31 ENCOUNTER — Other Ambulatory Visit (INDEPENDENT_AMBULATORY_CARE_PROVIDER_SITE_OTHER): Payer: BLUE CROSS/BLUE SHIELD

## 2015-01-31 DIAGNOSIS — Z1211 Encounter for screening for malignant neoplasm of colon: Secondary | ICD-10-CM | POA: Diagnosis not present

## 2015-01-31 LAB — FECAL OCCULT BLOOD, IMMUNOCHEMICAL: FECAL OCCULT BLD: NEGATIVE

## 2015-02-11 ENCOUNTER — Other Ambulatory Visit: Payer: Self-pay | Admitting: Family Medicine

## 2015-03-08 ENCOUNTER — Ambulatory Visit: Payer: BLUE CROSS/BLUE SHIELD | Admitting: Family Medicine

## 2015-07-23 ENCOUNTER — Ambulatory Visit (INDEPENDENT_AMBULATORY_CARE_PROVIDER_SITE_OTHER): Payer: BLUE CROSS/BLUE SHIELD | Admitting: Family Medicine

## 2015-07-23 ENCOUNTER — Encounter: Payer: Self-pay | Admitting: Family Medicine

## 2015-07-23 VITALS — BP 106/70 | HR 108 | Wt 239.0 lb

## 2015-07-23 DIAGNOSIS — M6588 Other synovitis and tenosynovitis, other site: Secondary | ICD-10-CM | POA: Diagnosis not present

## 2015-07-23 DIAGNOSIS — M779 Enthesopathy, unspecified: Principal | ICD-10-CM

## 2015-07-23 DIAGNOSIS — M778 Other enthesopathies, not elsewhere classified: Secondary | ICD-10-CM

## 2015-07-23 NOTE — Progress Notes (Signed)
Pre visit review using our clinic review tool, if applicable. No additional management support is needed unless otherwise documented below in the visit note.  L thumb pain.  Extensor side.  Ongoing for months.  No weakness.  No other sig hand pain.  No palmar pain.  Pain with picking up objects and abduction.  Puffy locally.  Sensation is still normal.    Meds, vitals, and allergies reviewed.   ROS: Per HPI unless specifically indicated in ROS section   nad L thumb w/o erythema, minimally puffy proximally w/o bruising.   Normal ROM w/o tendon failure but finklestein's testing positive.   Intrinsic pos, able to make a fist.  NV intact distally.

## 2015-07-23 NOTE — Patient Instructions (Signed)
Ice your wrist/thumb and use the spica brace.  This should gradually improve.  If not, then let us know.  Take care.  Glad to see you.

## 2015-07-24 NOTE — Assessment & Plan Note (Addendum)
Anatomy d/w pt.  Extensor tendonitis.   Advised pt to: Ice your wrist/thumb and use the spica brace. This should gradually improve. If not, then let us know.  He agrees.  No imaging needed.  rx for spica brace given to patient.

## 2015-08-30 NOTE — Telephone Encounter (Signed)
error 

## 2015-11-08 ENCOUNTER — Ambulatory Visit (INDEPENDENT_AMBULATORY_CARE_PROVIDER_SITE_OTHER)
Admission: RE | Admit: 2015-11-08 | Discharge: 2015-11-08 | Disposition: A | Discharge status: Home / Self Care | Payer: BLUE CROSS/BLUE SHIELD | Source: Ambulatory Visit | Attending: Family Medicine | Admitting: Family Medicine

## 2015-11-08 ENCOUNTER — Ambulatory Visit (INDEPENDENT_AMBULATORY_CARE_PROVIDER_SITE_OTHER): Payer: BLUE CROSS/BLUE SHIELD | Admitting: Family Medicine

## 2015-11-08 ENCOUNTER — Encounter: Payer: Self-pay | Admitting: Family Medicine

## 2015-11-08 VITALS — BP 144/84 | HR 76 | Temp 98.3°F | Wt 244.8 lb

## 2015-11-08 DIAGNOSIS — R1012 Left upper quadrant pain: Secondary | ICD-10-CM

## 2015-11-08 DIAGNOSIS — R109 Unspecified abdominal pain: Secondary | ICD-10-CM | POA: Diagnosis not present

## 2015-11-08 LAB — POC URINALSYSI DIPSTICK (AUTOMATED)
Bilirubin, UA: NEGATIVE
Blood, UA: NEGATIVE
GLUCOSE UA: NEGATIVE
Ketones, UA: NEGATIVE
LEUKOCYTES UA: NEGATIVE
Nitrite, UA: NEGATIVE
PROTEIN UA: NEGATIVE
UROBILINOGEN UA: 0.2
pH, UA: 6

## 2015-11-08 MED ORDER — TAMSULOSIN HCL 0.4 MG PO CAPS: 0.4000 mg | ORAL_CAPSULE | Freq: Every day | ORAL | 0 refills | Status: DC

## 2015-11-08 NOTE — Progress Notes (Signed)
10/28/15.  Went golfing.  2-3 days later started having lower back pain.  Central lower back pain.  He attributed that to golf.  Use heat patches and ibuprofen.  Then a few days ago, sx totally resolved.    Now, since yesterday AM with L flank pain.  Clearly feels different than prev.  No clear trigger o/w.  Took some ibuprofen this AM with some relief.  No h/o renal stones.  No burning with urination.  No FCNAVD.  No blood seen in urine prev.  Walking some for exercise at baseline.  He is leaving town Monday.    Meds, vitals, and allergies reviewed.   ROS: Per HPI unless specifically indicated in ROS section   GEN: nad, alert and oriented HEENT: mucous membranes moist NECK: supple w/o LA CV: rrr. PULM: ctab, no inc wob ABD: soft, +bs EXT: no edema SKIN: no acute rash  Xray reviewed, d/w pt.

## 2015-11-08 NOTE — Patient Instructions (Signed)
Likely deep muscle strain.  Ibuprofen 400mg  3 times a day with food.  Try heat in the meantime.  If you have blood in urine, then start flomax.  Take care.  Glad to see you.

## 2015-11-08 NOTE — Progress Notes (Signed)
Pre visit review using our clinic review tool, if applicable. No additional management support is needed unless otherwise documented below in the visit note. 

## 2015-11-10 ENCOUNTER — Encounter: Payer: Self-pay | Admitting: Family Medicine

## 2015-11-10 DIAGNOSIS — R109 Unspecified abdominal pain: Secondary | ICD-10-CM | POA: Insufficient documentation

## 2015-11-10 NOTE — Assessment & Plan Note (Signed)
Patient is nontoxic. Discussed with him about labs and x-ray. UA without blood. KUB with stool noted in the colon. There was a question about ileus based on the radiographic findings. He has a benign abdomen. Normal bowel movement this morning. Normal bowel sounds at the office visit. Abdomen is soft. I have no clinical concern for ileus or small bowel obstruction. Discussed with patient. He could have a stone was not seen on the x-ray. I gave him prescription for Flomax to hold in the meantime. Otherwise use ibuprofen in the meantime. Routine GI cautions on that medicine. Advised not use meloxicam in the meantime. He will update [place] as needed. Okay for outpatient follow-up. >25 minutes spent in face to face time with patient, >50% spent in counselling or coordination of care.

## 2015-12-05 ENCOUNTER — Other Ambulatory Visit: Payer: Self-pay | Admitting: Cardiology

## 2015-12-05 NOTE — Telephone Encounter (Signed)
Rx request sent to pharmacy.  

## 2015-12-26 ENCOUNTER — Encounter: Payer: Self-pay | Admitting: Cardiology

## 2016-01-03 NOTE — Progress Notes (Signed)
HPI: fu CAD; admitted from the office on 04/07/10 with unstable angina. He had Myoview study that day that was abnormal with inferior scar and mild to moderate peri-Infarct ischemia. EF was 48%. Cardiac catheterization demonstrated 30% LAD stenosis and mild plaque in the circumflex. He had a mid 99% and then an ulcerated 80% stenosis in the RCA. This was treated with 2 drug-eluting stents (Promus). He also had a 90% ostial posterior lateral branch stenosis. There are left to right collaterals. EF 55%. Nuclear study November 2016 showed ejection fraction 57% with small prior infarct but no ischemia. Since I last saw him, the patient denies any dyspnea on exertion, orthopnea, PND, pedal edema, palpitations, syncope or chest pain.   Current Outpatient Prescriptions  Medication Sig Dispense Refill  . Ascorbic Acid (VITAMIN C) 1000 MG tablet Take 1,000 mg by mouth daily.      Marland Kitchen aspirin 81 MG tablet Take 81 mg by mouth daily.      . fish oil-omega-3 fatty acids 1000 MG capsule Take 1 g by mouth daily.      Marland Kitchen lisinopril (PRINIVIL,ZESTRIL) 40 MG tablet TAKE 1 TABLET (40 MG TOTAL) BY MOUTH DAILY. 30 tablet 11  . metoprolol succinate (TOPROL-XL) 25 MG 24 hr tablet TAKE 1 TABLET (25 MG TOTAL) BY MOUTH DAILY. 30 tablet 11  . NITROSTAT 0.4 MG SL tablet PLACE 1 TABLET UNDER TONGUE EVERY 5 MINS FOR 3 DOSES AS NEEDED 25 tablet 2  . pantoprazole (PROTONIX) 40 MG tablet TAKE 1 TABLET (40 MG TOTAL) BY MOUTH DAILY. 30 tablet 11  . rosuvastatin (CRESTOR) 40 MG tablet TAKE 1 TABLET BY MOUTH EVERY DAY 30 tablet 11  . tamsulosin (FLOMAX) 0.4 MG CAPS capsule Take 1 capsule (0.4 mg total) by mouth daily. 30 capsule 0   No current facility-administered medications for this visit.      Past Medical History:  Diagnosis Date  . Abnormal nuclear cardiac imaging test 04/07/10   EF 48%; inferior scar; mild-moderate peri-infarct ischemia  . CAD (coronary artery disease)    s/p Promus DES x 2 RCA 04/08/10;  Cath 2/12:  mLAD 30%; CFX mild plaque; mRCA 99% then ulc. 80% tx'd with PCI, dRCA 30%; oPLB 90% (L-R collat); EF 55%  . Colon polyps   . GERD (gastroesophageal reflux disease)   . Hemorrhoids   . Hyperlipidemia   . Hypertension     Past Surgical History:  Procedure Laterality Date  . COLONOSCOPY W/ POLYPECTOMY  2010  . Left heart catheterization  04/09/10  . PTCA  04/09/10    with placement two overlapping drug-eluting stents in the mid right coronary artery.   . TONSILLECTOMY      Social History   Social History  . Marital status: Married    Spouse name: N/A  . Number of children: N/A  . Years of education: N/A   Occupational History  . Not on file.   Social History Main Topics  . Smoking status: Former Smoker    Types: Cigarettes    Quit date: 02/23/2005  . Smokeless tobacco: Never Used     Comment: Quit smoking 02/2005.  Marland Kitchen Alcohol use 2.4 oz/week    4 Cans of beer per week     Comment: occ  . Drug use: No  . Sexual activity: Not on file   Other Topics Concern  . Not on file   Social History Narrative   Regular exercise at gym.    Family History  Problem  Relation Age of Onset  . Heart attack Father   . Diabetes Father   . Heart disease Father     CAD  . Heart disease Other     Strong family hx of CAD  . Sudden death Cousin     ? Cardiac  . Kidney disease Maternal Aunt   . Kidney disease Maternal Uncle   . Kidney disease Paternal Aunt   . Prostate cancer Paternal Uncle   . Liver disease Paternal Uncle   . Kidney disease Paternal Uncle     ROS: no fevers or chills, productive cough, hemoptysis, dysphasia, odynophagia, melena, hematochezia, dysuria, hematuria, rash, seizure activity, orthopnea, PND, pedal edema, claudication. Remaining systems are negative.  Physical Exam: Well-developed well-nourished in no acute distress.  Skin is warm and dry.  HEENT is normal.  Neck is supple.  Chest is clear to auscultation with normal expansion.  Cardiovascular exam is  regular rate and rhythm.  Abdominal exam nontender or distended. No masses palpated. Extremities show no edema. neuro grossly intact  ECG-Sinus rhythm at a rate of 89. No ST changes.  A/P  1 Coronary artery disease-continue aspirin and statin. Last nuclear study low risk.  2 hypertension-blood pressure controlled. Continue present medications. Check potassium and renal function.   3 hyperlipidemia-continue statin.Check lipids and liver.   Kirk Ruths, MD

## 2016-01-06 ENCOUNTER — Encounter: Payer: Self-pay | Admitting: Family Medicine

## 2016-01-10 ENCOUNTER — Encounter: Payer: Self-pay | Admitting: Cardiology

## 2016-01-10 ENCOUNTER — Ambulatory Visit (INDEPENDENT_AMBULATORY_CARE_PROVIDER_SITE_OTHER): Payer: BLUE CROSS/BLUE SHIELD | Admitting: Cardiology

## 2016-01-10 VITALS — BP 122/84 | HR 88 | Ht 73.0 in | Wt 240.4 lb

## 2016-01-10 DIAGNOSIS — E7800 Pure hypercholesterolemia, unspecified: Secondary | ICD-10-CM | POA: Diagnosis not present

## 2016-01-10 DIAGNOSIS — I10 Essential (primary) hypertension: Secondary | ICD-10-CM | POA: Diagnosis not present

## 2016-01-10 DIAGNOSIS — I2510 Atherosclerotic heart disease of native coronary artery without angina pectoris: Secondary | ICD-10-CM

## 2016-01-10 NOTE — Patient Instructions (Signed)
Your physician wants you to follow-up in: ONE YEAR WITH DR CRENSHAW You will receive a reminder letter in the mail two months in advance. If you don't receive a letter, please call our office to schedule the follow-up appointment.   If you need a refill on your cardiac medications before your next appointment, please call your pharmacy.  

## 2016-01-19 ENCOUNTER — Telehealth: Payer: Self-pay | Admitting: Family Medicine

## 2016-01-19 DIAGNOSIS — Z125 Encounter for screening for malignant neoplasm of prostate: Secondary | ICD-10-CM

## 2016-01-19 DIAGNOSIS — Z0000 Encounter for general adult medical examination without abnormal findings: Secondary | ICD-10-CM

## 2016-01-19 NOTE — Telephone Encounter (Signed)
-----   Message from Marchia Bond sent at 01/13/2016  3:55 PM EST ----- Regarding: Cpx labs Thurs 11/30, need orders. Thanks! :-) Please order  future cpx labs for pt's upcoming lab appt. Thanks Aniceto Boss

## 2016-01-23 ENCOUNTER — Other Ambulatory Visit (INDEPENDENT_AMBULATORY_CARE_PROVIDER_SITE_OTHER): Payer: BLUE CROSS/BLUE SHIELD

## 2016-01-23 ENCOUNTER — Encounter: Payer: Self-pay | Admitting: Cardiology

## 2016-01-23 DIAGNOSIS — Z125 Encounter for screening for malignant neoplasm of prostate: Secondary | ICD-10-CM | POA: Diagnosis not present

## 2016-01-23 DIAGNOSIS — Z0000 Encounter for general adult medical examination without abnormal findings: Secondary | ICD-10-CM | POA: Diagnosis not present

## 2016-01-23 LAB — COMPREHENSIVE METABOLIC PANEL
ALK PHOS: 43 U/L (ref 39–117)
ALT: 29 U/L (ref 0–53)
AST: 21 U/L (ref 0–37)
Albumin: 4.7 g/dL (ref 3.5–5.2)
BILIRUBIN TOTAL: 0.6 mg/dL (ref 0.2–1.2)
BUN: 23 mg/dL (ref 6–23)
CO2: 31 mEq/L (ref 19–32)
Calcium: 10.1 mg/dL (ref 8.4–10.5)
Chloride: 101 mEq/L (ref 96–112)
Creatinine, Ser: 1.13 mg/dL (ref 0.40–1.50)
GFR: 70.86 mL/min (ref >60.00)
GLUCOSE: 69 mg/dL — AB (ref 70–99)
Potassium: 5.2 mEq/L — ABNORMAL HIGH (ref 3.5–5.1)
SODIUM: 139 meq/L (ref 135–145)
TOTAL PROTEIN: 6.8 g/dL (ref 6.0–8.3)

## 2016-01-23 LAB — TSH: TSH: 0.98 u[IU]/mL (ref 0.35–4.50)

## 2016-01-23 LAB — CBC WITH DIFFERENTIAL/PLATELET
BASOS ABS: 0.1 10*3/uL (ref 0.0–0.1)
Basophils Relative: 0.7 % (ref 0.0–3.0)
EOS ABS: 0.4 10*3/uL (ref 0.0–0.7)
Eosinophils Relative: 4.6 % (ref 0.0–5.0)
HCT: 42.9 % (ref 39.0–52.0)
Hemoglobin: 14.5 g/dL (ref 13.0–17.0)
LYMPHS ABS: 2.1 10*3/uL (ref 0.7–4.0)
Lymphocytes Relative: 24.9 % (ref 12.0–46.0)
MCHC: 33.7 g/dL (ref 30.0–36.0)
MCV: 91.4 fl (ref 78.0–100.0)
MONO ABS: 0.8 10*3/uL (ref 0.1–1.0)
MONOS PCT: 9.4 % (ref 3.0–12.0)
NEUTROS PCT: 60.4 % (ref 43.0–77.0)
Neutro Abs: 5.2 10*3/uL (ref 1.4–7.7)
Platelets: 271 10*3/uL (ref 150.0–400.0)
RBC: 4.7 Mil/uL (ref 4.22–5.81)
RDW: 12.9 % (ref 11.5–15.5)
WBC: 8.6 10*3/uL (ref 4.0–10.5)

## 2016-01-23 LAB — LIPID PANEL
CHOL/HDL RATIO: 4
Cholesterol: 166 mg/dL (ref 0–200)
HDL: 46.4 mg/dL (ref >39.00)
LDL Cholesterol: 92 mg/dL (ref 0–99)
NONHDL: 119.5
Triglycerides: 140 mg/dL (ref 0.0–149.0)
VLDL: 28 mg/dL (ref 0.0–40.0)

## 2016-01-23 LAB — PSA: PSA: 2.6 ng/mL (ref 0.10–4.00)

## 2016-01-24 ENCOUNTER — Encounter: Payer: Self-pay | Admitting: *Deleted

## 2016-01-28 ENCOUNTER — Encounter: Payer: BLUE CROSS/BLUE SHIELD | Admitting: Family Medicine

## 2016-02-03 ENCOUNTER — Other Ambulatory Visit: Payer: Self-pay | Admitting: Family Medicine

## 2016-02-04 ENCOUNTER — Encounter: Payer: Self-pay | Admitting: Family Medicine

## 2016-02-04 ENCOUNTER — Ambulatory Visit (INDEPENDENT_AMBULATORY_CARE_PROVIDER_SITE_OTHER): Payer: BLUE CROSS/BLUE SHIELD | Admitting: Family Medicine

## 2016-02-04 VITALS — BP 126/74 | HR 73 | Temp 97.8°F | Ht 70.5 in | Wt 238.5 lb

## 2016-02-04 DIAGNOSIS — Z125 Encounter for screening for malignant neoplasm of prostate: Secondary | ICD-10-CM | POA: Diagnosis not present

## 2016-02-04 DIAGNOSIS — Z0000 Encounter for general adult medical examination without abnormal findings: Secondary | ICD-10-CM | POA: Diagnosis not present

## 2016-02-04 DIAGNOSIS — I10 Essential (primary) hypertension: Secondary | ICD-10-CM

## 2016-02-04 DIAGNOSIS — E7800 Pure hypercholesterolemia, unspecified: Secondary | ICD-10-CM

## 2016-02-04 DIAGNOSIS — R972 Elevated prostate specific antigen [PSA]: Secondary | ICD-10-CM

## 2016-02-04 MED ORDER — ROSUVASTATIN CALCIUM 40 MG PO TABS: 40.0000 mg | ORAL_TABLET | Freq: Every day | ORAL | 11 refills | Status: DC

## 2016-02-04 NOTE — Patient Instructions (Signed)
Stop up front for referral to urology  Take care of yourself Keep working on weight loss and fitness  Labs are re assuring

## 2016-02-04 NOTE — Progress Notes (Signed)
Subjective:    Patient ID: Richard Baldwin, male    DOB: 29-Jun-1955, 59 y.o.   MRN: [id number]  HPI  Here for health maintenance exam and to review chronic medical problems    Still working and traveling a lot  Busy / crazy  Not a lot of time to care for himself   Feels pretty good   Cardiology f/u recent - no changes/ CAD Some ortho issues    Wt Readings from Last 3 Encounters:  02/04/16 238 lb 8 oz (108.2 kg)  01/10/16 240 lb 6 oz (109 kg)  11/08/15 244 lb 12 oz (111 kg)  down from 250 lb a year ago  Slow process - but doing well / portion control / better choices and also exercise (treadmill and stationary bike)  bmi is 33.7  Tetanus vaccine 11/09  Flex sig 7/14- colitis  ifob kit neg 12/16 Think he will be due for colonsc 2020-= colonosc 2010 small polyp not adenomatous   Flu shot 9/17  Sees dermatology-Dr Dasher- removed a skin cancer on back of his neck- watching him close   Prostate screen Lab Results  Component Value Date   PSA 2.60 01/23/2016   PSA 1.81 01/15/2015   PSA 1.79 01/15/2014   no change in urinary symptoms  Nocturia -no more than once (or none)  No flow or stream changes  Empties bladder fine  No frequency as a rule unless he drinks a bunch of water quickly  2 paternal uncles with prostate cancer     bp is stable today  No cp or palpitations or headaches or edema  No side effects to medicines  BP Readings from Last 3 Encounters:  02/04/16 126/74  01/10/16 122/84  11/08/15 (!) 144/84      Hx of hyperlipidemia in setting of CAD Lab Results  Component Value Date   CHOL 166 01/23/2016   CHOL 155 01/15/2015   CHOL 146 01/15/2014   Lab Results  Component Value Date   HDL 46.40 01/23/2016   HDL 45.70 01/15/2015   HDL 45.80 01/15/2014   Lab Results  Component Value Date   LDLCALC 92 01/23/2016   LDLCALC 82 01/15/2015   LDLCALC 68 01/15/2014   Lab Results  Component Value Date   TRIG 140.0 01/23/2016   TRIG 138.0  01/15/2015   TRIG 160.0 (H) 01/15/2014   Lab Results  Component Value Date   CHOLHDL 4 01/23/2016   CHOLHDL 3 01/15/2015   CHOLHDL 3 01/15/2014   Lab Results  Component Value Date   LDLDIRECT 99.3 02/12/2012   LDLDIRECT 124.5 08/19/2009   LDLDIRECT 105.3 03/04/2009   On crestor and diet  Doing well/ stable /followed by cardiology   Results for orders placed or performed in visit on 01/23/16  CBC with Differential/Platelet  Result Value Ref Range   WBC 8.6 4.0 - 10.5 K/uL   RBC 4.70 4.22 - 5.81 Mil/uL   Hemoglobin 14.5 13.0 - 17.0 g/dL   HCT 42.9 39.0 - 52.0 %   MCV 91.4 78.0 - 100.0 fl   MCHC 33.7 30.0 - 36.0 g/dL   RDW 12.9 11.5 - 15.5 %   Platelets 271.0 150.0 - 400.0 K/uL   Neutrophils Relative % 60.4 43.0 - 77.0 %   Lymphocytes Relative 24.9 12.0 - 46.0 %   Monocytes Relative 9.4 3.0 - 12.0 %   Eosinophils Relative 4.6 0.0 - 5.0 %   Basophils Relative 0.7 0.0 - 3.0 %   Neutro Abs 5.2  1.4 - 7.7 K/uL   Lymphs Abs 2.1 0.7 - 4.0 K/uL   Monocytes Absolute 0.8 0.1 - 1.0 K/uL   Eosinophils Absolute 0.4 0.0 - 0.7 K/uL   Basophils Absolute 0.1 0.0 - 0.1 K/uL  Comprehensive metabolic panel  Result Value Ref Range   Sodium 139 135 - 145 mEq/L   Potassium 5.2 (H) 3.5 - 5.1 mEq/L   Chloride 101 96 - 112 mEq/L   CO2 31 19 - 32 mEq/L   Glucose, Bld 69 (L) 70 - 99 mg/dL   BUN 23 6 - 23 mg/dL   Creatinine, Ser 1.13 0.40 - 1.50 mg/dL   Total Bilirubin 0.6 0.2 - 1.2 mg/dL   Alkaline Phosphatase 43 39 - 117 U/L   AST 21 0 - 37 U/L   ALT 29 0 - 53 U/L   Total Protein 6.8 6.0 - 8.3 g/dL   Albumin 4.7 3.5 - 5.2 g/dL   Calcium 10.1 8.4 - 10.5 mg/dL   GFR 70.86 >60.00 mL/min  Lipid panel  Result Value Ref Range   Cholesterol 166 0 - 200 mg/dL   Triglycerides 140.0 0.0 - 149.0 mg/dL   HDL 46.40 >39.00 mg/dL   VLDL 28.0 0.0 - 40.0 mg/dL   LDL Cholesterol 92 0 - 99 mg/dL   Total CHOL/HDL Ratio 4    NonHDL 119.50   PSA  Result Value Ref Range   PSA 2.60 0.10 - 4.00 ng/mL    TSH  Result Value Ref Range   TSH 0.98 0.35 - 4.50 uIU/mL    K is 5.2  On lisinopril    Review of Systems Review of Systems  Constitutional: Negative for fever, appetite change, fatigue and unexpected weight change.  Eyes: Negative for pain and visual disturbance.  Respiratory: Negative for cough and shortness of breath.   Cardiovascular: Negative for cp or palpitations    Gastrointestinal: Negative for nausea, diarrhea and constipation.  Genitourinary: Negative for urgency and frequency.  Skin: Negative for pallor or rash   MSK pos for intermittent back pain / saw ortho , also hand pain  Neurological: Negative for weakness, light-headedness, numbness and headaches.  Hematological: Negative for adenopathy. Does not bruise/bleed easily.  Psychiatric/Behavioral: Negative for dysphoric mood. The patient is not nervous/anxious.         Objective:   Physical Exam  Constitutional: He appears well-developed and well-nourished. No distress.  obese and well appearing   HENT:  Head: Normocephalic and atraumatic.  Right Ear: External ear normal.  Left Ear: External ear normal.  Nose: Nose normal.  Mouth/Throat: Oropharynx is clear and moist.  Eyes: Conjunctivae and EOM are normal. Pupils are equal, round, and reactive to light. Right eye exhibits no discharge. Left eye exhibits no discharge. No scleral icterus.  Neck: Normal range of motion. Neck supple. No JVD present. Carotid bruit is not present. No thyromegaly present.  Cardiovascular: Normal rate, regular rhythm, normal heart sounds and intact distal pulses.  Exam reveals no gallop.   Pulmonary/Chest: Effort normal and breath sounds normal. No respiratory distress. He has no wheezes. He exhibits no tenderness.  Abdominal: Soft. Bowel sounds are normal. He exhibits no distension, no abdominal bruit and no mass. There is no tenderness.  Musculoskeletal: He exhibits no edema or tenderness.  Lymphadenopathy:    He has no cervical  adenopathy.  Neurological: He is alert. He has normal reflexes. No cranial nerve deficit. He exhibits normal muscle tone. Coordination normal.  Skin: Skin is warm and dry. No rash  noted. No erythema. No pallor.  Ruddy complexion with lentigines   Psychiatric: He has a normal mood and affect.          Assessment & Plan:   Problem List Items Addressed This Visit      Cardiovascular and Mediastinum   Essential hypertension - Primary    bp in fair control at this time  BP Readings from Last 1 Encounters:  02/04/16 126/74   No changes needed Disc lifstyle change with low sodium diet and exercise  Also watched by cardiology  K is 5.2 with lisinopril-will watch Enc fluids       Relevant Medications   rosuvastatin (CRESTOR) 40 MG tablet     Other   Routine general medical examination at a health care facility    Reviewed health habits including diet and exercise and skin cancer prevention Reviewed appropriate screening tests for age  Also reviewed health mt list, fam hx and immunization status , as well as social and family history   See HPI Labs rev  Exercising and loosing wt- commended Stable CAD Well controlled cholesterol  Ref to urology due to recent inc in PSA and remote family hx of prostate cancer  utd imms       PSA elevation    Lab Results  Component Value Date   PSA 2.60 01/23/2016   PSA 1.81 01/15/2015   PSA 1.79 01/15/2014    This was more of an inc than expected fam hx of prostate ca in uncles No symptoms Ref to urology       Relevant Orders   Ambulatory referral to Urology   Prostate cancer screening    Lab Results  Component Value Date   PSA 2.60 01/23/2016   PSA 1.81 01/15/2015   PSA 1.79 01/15/2014   Ref to urol for eval - with fam hx of prostate cancer and inc psa this time      HYPERCHOLESTEROLEMIA    Fairly good control with generic crestor and diet Disc goals for lipids and reasons to control them Rev labs with pt Rev low sat fat  diet in detail CAD followed by cardiology  Better habits now       Relevant Medications   rosuvastatin (CRESTOR) 40 MG tablet

## 2016-02-04 NOTE — Progress Notes (Signed)
Pre visit review using our clinic review tool, if applicable. No additional management support is needed unless otherwise documented below in the visit note. 

## 2016-02-04 NOTE — Assessment & Plan Note (Signed)
Fairly good control with generic crestor and diet Disc goals for lipids and reasons to control them Rev labs with pt Rev low sat fat diet in detail CAD followed by cardiology  Better habits now

## 2016-02-04 NOTE — Assessment & Plan Note (Signed)
bp in fair control at this time  BP Readings from Last 1 Encounters:  02/04/16 126/74   No changes needed Disc lifstyle change with low sodium diet and exercise  Also watched by cardiology  K is 5.2 with lisinopril-will watch Enc fluids

## 2016-02-04 NOTE — Assessment & Plan Note (Signed)
Reviewed health habits including diet and exercise and skin cancer prevention Reviewed appropriate screening tests for age  Also reviewed health mt list, fam hx and immunization status , as well as social and family history   See HPI Labs rev  Exercising and loosing wt- commended Stable CAD Well controlled cholesterol  Ref to urology due to recent inc in PSA and remote family hx of prostate cancer  utd imms

## 2016-02-04 NOTE — Assessment & Plan Note (Signed)
Lab Results  Component Value Date   PSA 2.60 01/23/2016   PSA 1.81 01/15/2015   PSA 1.79 01/15/2014    This was more of an inc than expected fam hx of prostate ca in uncles No symptoms Ref to urology

## 2016-02-04 NOTE — Assessment & Plan Note (Signed)
Lab Results  Component Value Date   PSA 2.60 01/23/2016   PSA 1.81 01/15/2015   PSA 1.79 01/15/2014   Ref to urol for eval - with fam hx of prostate cancer and inc psa this time

## 2016-02-09 ENCOUNTER — Encounter: Payer: Self-pay | Admitting: Family Medicine

## 2016-02-10 ENCOUNTER — Telehealth: Payer: Self-pay | Admitting: Family Medicine

## 2016-02-10 NOTE — Telephone Encounter (Signed)
Pt notified letter ready and he will pick it up at the front desk later this week

## 2016-02-10 NOTE — Telephone Encounter (Signed)
Letter for work re: physical

## 2016-03-10 ENCOUNTER — Ambulatory Visit: Payer: BLUE CROSS/BLUE SHIELD | Admitting: Urology

## 2016-03-10 ENCOUNTER — Encounter: Payer: Self-pay | Admitting: Urology

## 2016-03-10 VITALS — BP 123/77 | HR 81 | Ht 70.5 in | Wt 245.1 lb

## 2016-03-10 DIAGNOSIS — R972 Elevated prostate specific antigen [PSA]: Secondary | ICD-10-CM | POA: Diagnosis not present

## 2016-03-10 NOTE — Progress Notes (Signed)
03/10/2016 5:55 AM   Richard Baldwin 12-29-1955 [id number]  Referring provider: Abner Greenspan, MD [address] [place], [place] [address]  CC: - rising PSA  HPI:  1 - Rising PSA - acute rise in PSA astutely noted by PCP 01/2016. Two uncles with prostate cancer. 2015 - PSA 1.79 2016 - PSA 1.81 2017 - PSA 2.6 / DRE 30gm smooth (no nodules)  PMH sig for CAD/Stent (no MI, not limiting at all at present), obesity. No prior chest/abd surgeries. His PCP is Loura Pardon MD with [facility].    PMH: Past Medical History:  Diagnosis Date  . Abnormal nuclear cardiac imaging test 04/07/10   EF 48%; inferior scar; mild-moderate peri-infarct ischemia  . CAD (coronary artery disease)    s/p Promus DES x 2 RCA 04/08/10;  Cath 2/12: mLAD 30%; CFX mild plaque; mRCA 99% then ulc. 80% tx'd with PCI, dRCA 30%; oPLB 90% (L-R collat); EF 55%  . Colon polyps   . GERD (gastroesophageal reflux disease)   . Hemorrhoids   . Hyperlipidemia   . Hypertension     Surgical History: Past Surgical History:  Procedure Laterality Date  . COLONOSCOPY W/ POLYPECTOMY  2010  . Left heart catheterization  04/09/10  . PTCA  04/09/10    with placement two overlapping drug-eluting stents in the mid right coronary artery.   . TONSILLECTOMY      Home Medications:  Allergies as of 03/10/2016      Reactions   Atorvastatin    REACTION: elevated liver functions   Butenafine    REACTION: burned   Varenicline Tartrate    REACTION: sleepy      Medication List       Accurate as of 03/10/16  5:55 AM. Always use your most recent med list.          aspirin 81 MG tablet Take 81 mg by mouth daily.   fish oil-omega-3 fatty acids 1000 MG capsule Take 1 g by mouth daily.   lisinopril 40 MG tablet Commonly known as:  PRINIVIL,ZESTRIL TAKE 1 TABLET (40 MG TOTAL) BY MOUTH DAILY.   metoprolol succinate 25 MG 24 hr tablet Commonly known as:  TOPROL-XL TAKE 1 TABLET (25 MG TOTAL) BY MOUTH DAILY.     NITROSTAT 0.4 MG SL tablet Generic drug:  nitroGLYCERIN PLACE 1 TABLET UNDER TONGUE EVERY 5 MINS FOR 3 DOSES AS NEEDED   pantoprazole 40 MG tablet Commonly known as:  PROTONIX TAKE 1 TABLET (40 MG TOTAL) BY MOUTH DAILY.   rosuvastatin 40 MG tablet Commonly known as:  CRESTOR Take 1 tablet (40 mg total) by mouth daily.   vitamin C 1000 MG tablet Take 1,000 mg by mouth daily.       Allergies:  Allergies  Allergen Reactions  . Atorvastatin     REACTION: elevated liver functions  . Butenafine     REACTION: burned  . Varenicline Tartrate     REACTION: sleepy    Family History: Family History  Problem Relation Age of Onset  . Heart attack Father   . Diabetes Father   . Heart disease Father     CAD  . Heart disease Other     Strong family hx of CAD  . Sudden death Cousin     ? Cardiac  . Kidney disease Maternal Aunt   . Kidney disease Maternal Uncle   . Kidney disease Paternal Aunt   . Prostate cancer Paternal Uncle   . Liver disease Paternal  Uncle   . Kidney disease Paternal Uncle     Social History:  reports that he quit smoking about 11 years ago. His smoking use included Cigarettes. He has never used smokeless tobacco. He reports that he drinks about 2.4 oz of alcohol per week . He reports that he does not use drugs.    Review of Systems  Gastrointestinal (upper)  : Negative for upper GI symptoms  Gastrointestinal (lower) : Negative for lower GI symptoms  Constitutional : Negative for symptoms  Skin: Negative for skin symptoms  Eyes: Negative for eye symptoms  Ear/Nose/Throat : Negative for Ear/Nose/Throat symptoms  Hematologic/Lymphatic: Negative for Hematologic/Lymphatic symptoms  Cardiovascular : Negative for cardiovascular symptoms  Respiratory : Negative for respiratory symptoms  Endocrine: Negative for endocrine symptoms  Musculoskeletal: Negative for musculoskeletal symptoms  Neurological: Negative for neurological  symptoms  Psychologic: Negative for psychiatric symptoms   Physical Exam: There were no vitals taken for this visit.  Constitutional:  Alert and oriented, No acute distress. HEENT: [place] AT, moist mucus membranes.  Trachea midline, no masses. Cardiovascular: No clubbing, cyanosis, or edema. Respiratory: Normal respiratory effort, no increased work of breathing. GI: Abdomen is soft, nontender, nondistended, no abdominal masses GU: No CVA tenderness. Circ'd. Phallus straight. No scrotal masses. DRE 30gm smooth.  Skin: No rashes, bruises or suspicious lesions. Lymph: No cervical or inguinal adenopathy. Neurologic: Grossly intact, no focal deficits, moving all 4 extremities. Psychiatric: Normal mood and affect.  Laboratory Data: Lab Results  Component Value Date   WBC 8.6 01/23/2016   HGB 14.5 01/23/2016   HCT 42.9 01/23/2016   MCV 91.4 01/23/2016   PLT 271.0 01/23/2016    Lab Results  Component Value Date   CREATININE 1.13 01/23/2016    Lab Results  Component Value Date   PSA 2.60 01/23/2016   PSA 1.81 01/15/2015   PSA 1.79 01/15/2014    No results found for: TESTOSTERONE  Lab Results  Component Value Date   HGBA1C  04/09/2010    5.6 (NOTE)                                                                       According to the ADA Clinical Practice Recommendations for 2011, when HbA1c is used as a screening test:   >=6.5%   Diagnostic of Diabetes Mellitus           (if abnormal result  is confirmed)  5.7-6.4%   Increased risk of developing Diabetes Mellitus  References:Diagnosis and Classification of Diabetes Mellitus,Diabetes [id number] 1):S62-S69 and Standards of Medical Care in         Diabetes - 2011,Diabetes Care,2011,34  (Suppl 1):S11-S61.    Urinalysis    Component Value Date/Time   BILIRUBINUR Neg 11/08/2015 1536   PROTEINUR Neg 11/08/2015 1536   UROBILINOGEN 0.2 11/08/2015 1536   NITRITE Neg 11/08/2015 1536   LEUKOCYTESUR Negative 11/08/2015 1536       Assessment & Plan:    1. PSA elevation - discussed DDX benig and malignant etiologies of PSA rise and natural history of prostate cancer and management options including lab recheck with abstinance prior v. Genetic tests v. Biopsy and he opts for recheck PSA in 4-6 weeks with 5 days abstinance prior. I agree. WE  both agree on BX should PSA remain >2.5.       Alexis Frock, [facility] [address] [place], [place] [address] [phone]

## 2016-04-10 ENCOUNTER — Other Ambulatory Visit: Payer: BLUE CROSS/BLUE SHIELD

## 2016-04-10 DIAGNOSIS — R972 Elevated prostate specific antigen [PSA]: Secondary | ICD-10-CM

## 2016-04-11 LAB — PSA TOTAL (REFLEX TO FREE): PROSTATE SPECIFIC AG, SERUM: 1.6 ng/mL (ref 0.0–4.0)

## 2016-04-17 ENCOUNTER — Encounter: Payer: Self-pay | Admitting: Urology

## 2016-04-17 ENCOUNTER — Ambulatory Visit: Payer: BLUE CROSS/BLUE SHIELD | Admitting: Urology

## 2016-04-17 VITALS — BP 130/76 | HR 75 | Ht 72.0 in | Wt 246.0 lb

## 2016-04-17 DIAGNOSIS — R972 Elevated prostate specific antigen [PSA]: Secondary | ICD-10-CM | POA: Diagnosis not present

## 2016-04-17 NOTE — Progress Notes (Signed)
Richard Baldwin 10-10-1955 [id number]  Referring provider: Abner Greenspan, MD [place], [place] [address]  CC: FU of rising PSA  HPI:  1 - Rising PSA - acute rise in PSA astutely noted by PCP 01/2016. Two uncles with prostate cancer. 2015 - PSA 1.79 2016 - PSA 1.81 2017 - PSA 2.6 / DRE 30gm smooth (no nodules) ==> recheck 03/2016 1.6 (back to baseline)  PMH sig for CAD/Stent (no MI, not limiting at all at present), obesity. No prior chest/abd surgeries. His PCP is Loura Pardon MD with [facility].   Today "Gerald Stabs" is seen in f/u above. Recheck PSA improved and back to baseline. No new voidng symptoms. No new sexual complaints.   PMH: Past Medical History:  Diagnosis Date  . Abnormal nuclear cardiac imaging test 04/07/10   EF 48%; inferior scar; mild-moderate peri-infarct ischemia  . CAD (coronary artery disease)    s/p Promus DES x 2 RCA 04/08/10;  Cath 2/12: mLAD 30%; CFX mild plaque; mRCA 99% then ulc. 80% tx'd with PCI, dRCA 30%; oPLB 90% (L-R collat); EF 55%  . Colon polyps   . GERD (gastroesophageal reflux disease)   . Hemorrhoids   . Hyperlipidemia   . Hypertension     Surgical History: Past Surgical History:  Procedure Laterality Date  . COLONOSCOPY W/ POLYPECTOMY  2010  . Left heart catheterization  04/09/10  . PTCA  04/09/10    with placement two overlapping drug-eluting stents in the mid right coronary artery.   . TONSILLECTOMY      Home Medications:  Allergies as of 03/10/2016      Reactions   Atorvastatin    REACTION: elevated liver functions   Butenafine    REACTION: burned   Varenicline Tartrate    REACTION: sleepy      Medication List       Accurate as of 03/10/16  5:55 AM. Always use your most recent med list.          aspirin 81 MG tablet Take 81 mg by mouth daily.   fish oil-omega-3 fatty acids 1000 MG capsule Take 1 g by mouth daily.   lisinopril 40 MG tablet Commonly known as:  PRINIVIL,ZESTRIL TAKE 1 TABLET  (40 MG TOTAL) BY MOUTH DAILY.   metoprolol succinate 25 MG 24 hr tablet Commonly known as:  TOPROL-XL TAKE 1 TABLET (25 MG TOTAL) BY MOUTH DAILY.   NITROSTAT 0.4 MG SL tablet Generic drug:  nitroGLYCERIN PLACE 1 TABLET UNDER TONGUE EVERY 5 MINS FOR 3 DOSES AS NEEDED   pantoprazole 40 MG tablet Commonly known as:  PROTONIX TAKE 1 TABLET (40 MG TOTAL) BY MOUTH DAILY.   rosuvastatin 40 MG tablet Commonly known as:  CRESTOR Take 1 tablet (40 mg total) by mouth daily.   vitamin C 1000 MG tablet Take 1,000 mg by mouth daily.       Allergies:  Allergies  Allergen Reactions  . Atorvastatin     REACTION: elevated liver functions  . Butenafine     REACTION: burned  . Varenicline Tartrate     REACTION: sleepy    Family History: Family History  Problem Relation Age of Onset  . Heart attack Father   . Diabetes Father   . Heart disease Father     CAD  . Heart disease Other     Strong family hx of CAD  . Sudden death Cousin     ? Cardiac  . Kidney disease Maternal Aunt   .  Kidney disease Maternal Uncle   . Kidney disease Paternal Aunt   . Prostate cancer Paternal Uncle   . Liver disease Paternal Uncle   . Kidney disease Paternal Uncle     Social History:  reports that he quit smoking about 11 years ago. His smoking use included Cigarettes. He has never used smokeless tobacco. He reports that he drinks about 2.4 oz of alcohol per week . He reports that he does not use drugs.    Review of Systems  Gastrointestinal (upper)  : Negative for upper GI symptoms  Gastrointestinal (lower) : Negative for lower GI symptoms  Constitutional : Negative for symptoms  Skin: Negative for skin symptoms  Eyes: Negative for eye symptoms  Ear/Nose/Throat : Negative for Ear/Nose/Throat symptoms  Hematologic/Lymphatic: Negative for Hematologic/Lymphatic symptoms  Cardiovascular : Negative for cardiovascular symptoms  Respiratory : Negative for respiratory  symptoms  Endocrine: Negative for endocrine symptoms  Musculoskeletal: Negative for musculoskeletal symptoms  Neurological: Negative for neurological symptoms  Psychologic: Negative for psychiatric symptoms   Physical Exam: There were no vitals taken for this visit.  Constitutional:  Alert and oriented, NAD HEENT: [place] AT, moist mucus membranes.  Trachea midline, no masses. No eye problems Cardiovascular: No clubbing, cyanosis, or edema. No JVD Respiratory: Normal respiratory effort, no increased work of breathing. GI: Abdomen is soft, nontender, nondistended, no abdominal masses. No hernias.  GU: No CVA tenderness. Circ'd. Phallus straight. No scrotal masses. DRE 30gm smooth and stable. Skin: No rashes, bruises or suspicious lesions. Lymph: No cervical or inguinal adenopathy. Neurologic: Grossly intact, no focal deficits, moving all 4 extremities. No tremor.  Psychiatric: Normal mood and affect.   Laboratory Data: As per HPI    Urinalysis    Component Value Date/Time   BILIRUBINUR Neg 11/08/2015 1536   PROTEINUR Neg 11/08/2015 1536   UROBILINOGEN 0.2 11/08/2015 1536   NITRITE Neg 11/08/2015 1536   LEUKOCYTESUR Negative 11/08/2015 1536      Assessment & Plan:    1. PSA elevation - back to baseline with recheck with few days abstinance prior. This is very reassuring. I feel certainly safe to resume annual screening with PCP only. Reinforced rec of absinance few days prior to future lab draws.   RTC Urol PRN.   Alexis Frock, [facility] [address], [place], [place] [address] [phone]

## 2016-08-29 ENCOUNTER — Other Ambulatory Visit: Payer: Self-pay | Admitting: Family Medicine

## 2016-09-06 ENCOUNTER — Other Ambulatory Visit: Payer: Self-pay | Admitting: Family Medicine

## 2016-09-07 ENCOUNTER — Telehealth: Payer: Self-pay | Admitting: *Deleted

## 2016-09-07 MED ORDER — ROSUVASTATIN CALCIUM 40 MG PO TABS: 40.0000 mg | ORAL_TABLET | Freq: Every day | ORAL | 1 refills | Status: DC

## 2016-09-07 NOTE — Telephone Encounter (Signed)
We have crestor on his list instead of lipitor - perhaps this is why it wwas refused? Per his chart- lipitor inc his liver tests  Please ask them about this -thanks  Do they want to try the lipitor again?

## 2016-09-07 NOTE — Telephone Encounter (Signed)
Pt's wife did mean to say the generic crestor not lipitor, Rx was declined because we refilled it for a year in Dec 2017 but wife let me know he needs 90 day supply, new Rx sent and wife notified

## 2016-09-07 NOTE — Telephone Encounter (Signed)
Patient's wife left a voicemail stating that the pharmacy told them that Dr. Glori Bickers refused to refill his generic Lipitor. Mrs. Curfman stated that this was last filled in April and he usually gets a 90 days supply on this.  Patient's wife requested a call back to try to get this taken care of. No longer on his medication list.   Last OV 02/04/16

## 2016-11-12 ENCOUNTER — Ambulatory Visit (INDEPENDENT_AMBULATORY_CARE_PROVIDER_SITE_OTHER): Payer: BLUE CROSS/BLUE SHIELD

## 2016-11-12 DIAGNOSIS — Z23 Encounter for immunization: Secondary | ICD-10-CM | POA: Diagnosis not present

## 2016-12-02 ENCOUNTER — Other Ambulatory Visit: Payer: Self-pay | Admitting: Cardiology

## 2016-12-18 ENCOUNTER — Other Ambulatory Visit: Payer: Self-pay | Admitting: Cardiology

## 2017-01-18 NOTE — Progress Notes (Deleted)
HPI: FU CAD; admitted from the office on 04/07/10 with unstable angina. He had Myoview study that day that was abnormal with inferior scar and mild to moderate peri-Infarct ischemia. EF was 48%. Cardiac catheterization demonstrated 30% LAD stenosis and mild plaque in the circumflex. He had a mid 99% and then an ulcerated 80% stenosis in the RCA. This was treated with 2 drug-eluting stents (Promus). He also had a 90% ostial posterior lateral branch stenosis. There are left to right collaterals. EF 55%. Nuclear study November 2016 showed ejection fraction 57% with small prior infarct but no ischemia. Since I last saw him,   Current Outpatient Medications  Medication Sig Dispense Refill  . Ascorbic Acid (VITAMIN C) 1000 MG tablet Take 1,000 mg by mouth daily.      Marland Kitchen aspirin 81 MG tablet Take 81 mg by mouth daily.      . fish oil-omega-3 fatty acids 1000 MG capsule Take 1 g by mouth daily.      Marland Kitchen lisinopril (PRINIVIL,ZESTRIL) 40 MG tablet TAKE 1 TABLET (40 MG TOTAL) BY MOUTH DAILY. 30 tablet 2  . metoprolol succinate (TOPROL-XL) 25 MG 24 hr tablet TAKE 1 TABLET (25 MG TOTAL) BY MOUTH DAILY. 30 tablet 11  . NITROSTAT 0.4 MG SL tablet PLACE 1 TABLET UNDER TONGUE EVERY 5 MINS FOR 3 DOSES AS NEEDED 25 tablet 2  . pantoprazole (PROTONIX) 40 MG tablet TAKE 1 TABLET (40 MG TOTAL) BY MOUTH DAILY. 30 tablet 11  . rosuvastatin (CRESTOR) 40 MG tablet Take 1 tablet (40 mg total) by mouth daily. 90 tablet 1   No current facility-administered medications for this visit.      Past Medical History:  Diagnosis Date  . Abnormal nuclear cardiac imaging test 04/07/10   EF 48%; inferior scar; mild-moderate peri-infarct ischemia  . CAD (coronary artery disease)    s/p Promus DES x 2 RCA 04/08/10;  Cath 2/12: mLAD 30%; CFX mild plaque; mRCA 99% then ulc. 80% tx'd with PCI, dRCA 30%; oPLB 90% (L-R collat); EF 55%  . Colon polyps   . GERD (gastroesophageal reflux disease)   . Hemorrhoids   . Hyperlipidemia     . Hypertension     Past Surgical History:  Procedure Laterality Date  . COLONOSCOPY W/ POLYPECTOMY  2010  . Left heart catheterization  04/09/10  . PTCA  04/09/10    with placement two overlapping drug-eluting stents in the mid right coronary artery.   . TONSILLECTOMY      Social History   Socioeconomic History  . Marital status: Married    Spouse name: Not on file  . Number of children: Not on file  . Years of education: Not on file  . Highest education level: Not on file  Social Needs  . Financial resource strain: Not on file  . Food insecurity - worry: Not on file  . Food insecurity - inability: Not on file  . Transportation needs - medical: Not on file  . Transportation needs - non-medical: Not on file  Occupational History  . Not on file  Tobacco Use  . Smoking status: Former Smoker    Types: Cigarettes    Last attempt to quit: 02/23/2005    Years since quitting: 11.9  . Smokeless tobacco: Never Used  . Tobacco comment: Quit smoking 02/2005.  Substance and Sexual Activity  . Alcohol use: Yes    Alcohol/week: 2.4 oz    Types: 4 Cans of beer per week  Comment: occ  . Drug use: No  . Sexual activity: Not on file  Other Topics Concern  . Not on file  Social History Narrative   Regular exercise at gym.    Family History  Problem Relation Age of Onset  . Heart attack Father   . Diabetes Father   . Heart disease Father        CAD  . Heart disease Other        Strong family hx of CAD  . Sudden death Cousin        ? Cardiac  . Kidney disease Maternal Aunt   . Kidney disease Maternal Uncle   . Kidney disease Paternal Aunt   . Prostate cancer Paternal Uncle   . Liver disease Paternal Uncle   . Kidney disease Paternal Uncle     ROS: no fevers or chills, productive cough, hemoptysis, dysphasia, odynophagia, melena, hematochezia, dysuria, hematuria, rash, seizure activity, orthopnea, PND, pedal edema, claudication. Remaining systems are negative.  Physical  Exam: Well-developed well-nourished in no acute distress.  Skin is warm and dry.  HEENT is normal.  Neck is supple.  Chest is clear to auscultation with normal expansion.  Cardiovascular exam is regular rate and rhythm.  Abdominal exam nontender or distended. No masses palpated. Extremities show no edema. neuro grossly intact  ECG- personally reviewed  A/P  1  Kirk Ruths, MD

## 2017-01-22 ENCOUNTER — Encounter: Payer: Self-pay | Admitting: Cardiology

## 2017-01-24 ENCOUNTER — Telehealth: Payer: Self-pay | Admitting: Family Medicine

## 2017-01-24 DIAGNOSIS — E7800 Pure hypercholesterolemia, unspecified: Secondary | ICD-10-CM

## 2017-01-24 DIAGNOSIS — I10 Essential (primary) hypertension: Secondary | ICD-10-CM

## 2017-01-24 DIAGNOSIS — Z125 Encounter for screening for malignant neoplasm of prostate: Secondary | ICD-10-CM

## 2017-01-24 NOTE — Telephone Encounter (Signed)
-----   Message from Ellamae Sia sent at 01/20/2017  4:04 PM EST ----- Regarding: Lab orders for Friday, 12.7.18 Patient is scheduled for CPX labs, please order future labs, Thanks , Karna Christmas

## 2017-01-27 ENCOUNTER — Other Ambulatory Visit: Payer: BLUE CROSS/BLUE SHIELD

## 2017-01-29 ENCOUNTER — Encounter: Payer: BLUE CROSS/BLUE SHIELD | Admitting: Family Medicine

## 2017-01-29 ENCOUNTER — Other Ambulatory Visit (INDEPENDENT_AMBULATORY_CARE_PROVIDER_SITE_OTHER): Payer: BLUE CROSS/BLUE SHIELD

## 2017-01-29 DIAGNOSIS — I10 Essential (primary) hypertension: Secondary | ICD-10-CM | POA: Diagnosis not present

## 2017-01-29 DIAGNOSIS — Z125 Encounter for screening for malignant neoplasm of prostate: Secondary | ICD-10-CM | POA: Diagnosis not present

## 2017-01-29 DIAGNOSIS — E7800 Pure hypercholesterolemia, unspecified: Secondary | ICD-10-CM

## 2017-01-29 LAB — COMPREHENSIVE METABOLIC PANEL
ALBUMIN: 4.5 g/dL (ref 3.5–5.2)
ALK PHOS: 46 U/L (ref 39–117)
ALT: 21 U/L (ref 0–53)
AST: 18 U/L (ref 0–37)
BILIRUBIN TOTAL: 0.4 mg/dL (ref 0.2–1.2)
BUN: 22 mg/dL (ref 6–23)
CALCIUM: 9.6 mg/dL (ref 8.4–10.5)
CHLORIDE: 103 meq/L (ref 96–112)
CO2: 29 mEq/L (ref 19–32)
CREATININE: 1.01 mg/dL (ref 0.40–1.50)
GFR: 80.38 mL/min (ref >60.00)
Glucose, Bld: 94 mg/dL (ref 70–99)
Potassium: 4.6 mEq/L (ref 3.5–5.1)
SODIUM: 139 meq/L (ref 135–145)
TOTAL PROTEIN: 6.4 g/dL (ref 6.0–8.3)

## 2017-01-29 LAB — CBC WITH DIFFERENTIAL/PLATELET
BASOS ABS: 0 10*3/uL (ref 0.0–0.1)
BASOS PCT: 0.6 % (ref 0.0–3.0)
Eosinophils Absolute: 0.3 10*3/uL (ref 0.0–0.7)
Eosinophils Relative: 4.1 % (ref 0.0–5.0)
HEMATOCRIT: 41.8 % (ref 39.0–52.0)
HEMOGLOBIN: 14.1 g/dL (ref 13.0–17.0)
LYMPHS PCT: 26.3 % (ref 12.0–46.0)
Lymphs Abs: 1.9 10*3/uL (ref 0.7–4.0)
MCHC: 33.6 g/dL (ref 30.0–36.0)
MCV: 92.7 fl (ref 78.0–100.0)
MONO ABS: 0.7 10*3/uL (ref 0.1–1.0)
Monocytes Relative: 10.1 % (ref 3.0–12.0)
Neutro Abs: 4.2 10*3/uL (ref 1.4–7.7)
Neutrophils Relative %: 58.9 % (ref 43.0–77.0)
Platelets: 251 10*3/uL (ref 150.0–400.0)
RBC: 4.51 Mil/uL (ref 4.22–5.81)
RDW: 12.8 % (ref 11.5–15.5)
WBC: 7.2 10*3/uL (ref 4.0–10.5)

## 2017-01-29 LAB — LIPID PANEL
CHOLESTEROL: 136 mg/dL (ref 0–200)
HDL: 40.4 mg/dL (ref >39.00)
LDL CALC: 59 mg/dL (ref 0–99)
NonHDL: 95.14
TRIGLYCERIDES: 181 mg/dL — AB (ref 0.0–149.0)
Total CHOL/HDL Ratio: 3
VLDL: 36.2 mg/dL (ref 0.0–40.0)

## 2017-01-29 LAB — TSH: TSH: 0.74 u[IU]/mL (ref 0.35–4.50)

## 2017-01-29 LAB — PSA: PSA: 1.82 ng/mL (ref 0.10–4.00)

## 2017-02-01 ENCOUNTER — Ambulatory Visit: Payer: BLUE CROSS/BLUE SHIELD | Admitting: Cardiology

## 2017-02-05 ENCOUNTER — Encounter: Payer: Self-pay | Admitting: Family Medicine

## 2017-02-05 ENCOUNTER — Ambulatory Visit (INDEPENDENT_AMBULATORY_CARE_PROVIDER_SITE_OTHER): Payer: BLUE CROSS/BLUE SHIELD | Admitting: Family Medicine

## 2017-02-05 VITALS — BP 122/76 | HR 86 | Temp 97.8°F | Ht 70.0 in | Wt 246.2 lb

## 2017-02-05 DIAGNOSIS — Z125 Encounter for screening for malignant neoplasm of prostate: Secondary | ICD-10-CM | POA: Diagnosis not present

## 2017-02-05 DIAGNOSIS — E7800 Pure hypercholesterolemia, unspecified: Secondary | ICD-10-CM

## 2017-02-05 DIAGNOSIS — I10 Essential (primary) hypertension: Secondary | ICD-10-CM

## 2017-02-05 DIAGNOSIS — R972 Elevated prostate specific antigen [PSA]: Secondary | ICD-10-CM

## 2017-02-05 DIAGNOSIS — Z0000 Encounter for general adult medical examination without abnormal findings: Secondary | ICD-10-CM

## 2017-02-05 MED ORDER — NITROGLYCERIN 0.4 MG SL SUBL: SUBLINGUAL_TABLET | SUBLINGUAL | 2 refills | Status: DC

## 2017-02-05 MED ORDER — ROSUVASTATIN CALCIUM 40 MG PO TABS: 40.0000 mg | ORAL_TABLET | Freq: Every day | ORAL | 3 refills | Status: DC

## 2017-02-05 NOTE — Assessment & Plan Note (Signed)
Disc goals for lipids and reasons to control them Rev labs with pt Rev low sat fat diet in detail Good control with crestor and diet  Disc inc exercise for HDL

## 2017-02-05 NOTE — Progress Notes (Signed)
Subjective:    Patient ID: Richard Baldwin, male    DOB: 08-Sep-1955, 61 y.o.   MRN: [id number]  HPI  Here for health maintenance exam and to review chronic medical problems    Wt Readings from Last 3 Encounters:  02/05/17 246 lb 4 oz (111.7 kg)  04/17/16 246 lb (111.6 kg)  03/10/16 245 lb 1.6 oz (111.2 kg)  got off track with weight loss - due to long work hours/travel  Exercise 1-2 times per week  (was more previously)  [facility] [employer] and [facility] gyms  35.33 kg/m   May be able to decrease work load in the next year or two   Tetanus shot 11/09  Colonoscopy 2010 -due 2020  Had flex sig in the meantime for colitis/ better now    Flu shot 9/18  PNA vaccine 11/15  Zoster status - would be interested in it when available   PSA went up last year  No prostate symptoms No nocturia  Saw urology-went back down  Recommended yearly psa from [place] after that  P uncle had prostate cancer Lab Results  Component Value Date   PSA 1.82 01/29/2017   PSA 2.60 01/23/2016   PSA 1.81 01/15/2015     bp is stable today  No cp or palpitations or headaches or edema  No side effects to medicines  BP Readings from Last 3 Encounters:  02/05/17 122/76  04/17/16 130/76  03/10/16 123/77      Cholesterol  Lab Results  Component Value Date   CHOL 136 01/29/2017   CHOL 166 01/23/2016   CHOL 155 01/15/2015   Lab Results  Component Value Date   HDL 40.40 01/29/2017   HDL 46.40 01/23/2016   HDL 45.70 01/15/2015   Lab Results  Component Value Date   LDLCALC 59 01/29/2017   LDLCALC 92 01/23/2016   LDLCALC 82 01/15/2015   Lab Results  Component Value Date   TRIG 181.0 (H) 01/29/2017   TRIG 140.0 01/23/2016   TRIG 138.0 01/15/2015   Lab Results  Component Value Date   CHOLHDL 3 01/29/2017   CHOLHDL 4 01/23/2016   CHOLHDL 3 01/15/2015   Lab Results  Component Value Date   LDLDIRECT 99.3 02/12/2012   LDLDIRECT 124.5 08/19/2009   LDLDIRECT 105.3  03/04/2009   crestor and diet  Trig are up  H/o CAD  Other labs: Results for orders placed or performed in visit on 01/29/17  PSA  Result Value Ref Range   PSA 1.82 0.10 - 4.00 ng/mL  TSH  Result Value Ref Range   TSH 0.74 0.35 - 4.50 uIU/mL  Lipid panel  Result Value Ref Range   Cholesterol 136 0 - 200 mg/dL   Triglycerides 181.0 (H) 0.0 - 149.0 mg/dL   HDL 40.40 >39.00 mg/dL   VLDL 36.2 0.0 - 40.0 mg/dL   LDL Cholesterol 59 0 - 99 mg/dL   Total CHOL/HDL Ratio 3    NonHDL 95.14   Comprehensive metabolic panel  Result Value Ref Range   Sodium 139 135 - 145 mEq/L   Potassium 4.6 3.5 - 5.1 mEq/L   Chloride 103 96 - 112 mEq/L   CO2 29 19 - 32 mEq/L   Glucose, Bld 94 70 - 99 mg/dL   BUN 22 6 - 23 mg/dL   Creatinine, Ser 1.01 0.40 - 1.50 mg/dL   Total Bilirubin 0.4 0.2 - 1.2 mg/dL   Alkaline Phosphatase 46 39 - 117 U/L  AST 18 0 - 37 U/L   ALT 21 0 - 53 U/L   Total Protein 6.4 6.0 - 8.3 g/dL   Albumin 4.5 3.5 - 5.2 g/dL   Calcium 9.6 8.4 - 10.5 mg/dL   GFR 80.38 >60.00 mL/min  CBC with Differential/Platelet  Result Value Ref Range   WBC 7.2 4.0 - 10.5 K/uL   RBC 4.51 4.22 - 5.81 Mil/uL   Hemoglobin 14.1 13.0 - 17.0 g/dL   HCT 41.8 39.0 - 52.0 %   MCV 92.7 78.0 - 100.0 fl   MCHC 33.6 30.0 - 36.0 g/dL   RDW 12.8 11.5 - 15.5 %   Platelets 251.0 150.0 - 400.0 K/uL   Neutrophils Relative % 58.9 43.0 - 77.0 %   Lymphocytes Relative 26.3 12.0 - 46.0 %   Monocytes Relative 10.1 3.0 - 12.0 %   Eosinophils Relative 4.1 0.0 - 5.0 %   Basophils Relative 0.6 0.0 - 3.0 %   Neutro Abs 4.2 1.4 - 7.7 K/uL   Lymphs Abs 1.9 0.7 - 4.0 K/uL   Monocytes Absolute 0.7 0.1 - 1.0 K/uL   Eosinophils Absolute 0.3 0.0 - 0.7 K/uL   Basophils Absolute 0.0 0.0 - 0.1 K/uL    Patient Active Problem List   Diagnosis Date Noted  . PSA elevation 02/04/2016  . Flank pain 11/10/2015  . Colon cancer screening 01/25/2015  . Routine general medical examination at a health care facility 12/08/2010   . Prostate cancer screening 12/08/2010  . CAD (coronary artery disease)   . UNSTABLE ANGINA 04/07/2010  . ABNORMAL EKG 03/04/2010  . HYPERCHOLESTEROLEMIA 05/28/2006  . Essential hypertension 05/28/2006  . HEMORRHOIDS, WITH BLEEDING 05/28/2006  . GERD 05/28/2006  . TOBACCO USE, QUIT 05/28/2006   Past Medical History:  Diagnosis Date  . Abnormal nuclear cardiac imaging test 04/07/10   EF 48%; inferior scar; mild-moderate peri-infarct ischemia  . CAD (coronary artery disease)    s/p Promus DES x 2 RCA 04/08/10;  Cath 2/12: mLAD 30%; CFX mild plaque; mRCA 99% then ulc. 80% tx'd with PCI, dRCA 30%; oPLB 90% (L-R collat); EF 55%  . Colon polyps   . GERD (gastroesophageal reflux disease)   . Hemorrhoids   . Hyperlipidemia   . Hypertension    Past Surgical History:  Procedure Laterality Date  . COLONOSCOPY W/ POLYPECTOMY  2010  . Left heart catheterization  04/09/10  . PTCA  04/09/10    with placement two overlapping drug-eluting stents in the mid right coronary artery.   . TONSILLECTOMY     Social History   Tobacco Use  . Smoking status: Former Smoker    Types: Cigarettes    Last attempt to quit: 02/23/2005    Years since quitting: 11.9  . Smokeless tobacco: Never Used  . Tobacco comment: Quit smoking 02/2005.  Substance Use Topics  . Alcohol use: Yes    Alcohol/week: 2.4 oz    Types: 4 Cans of beer per week    Comment: occ  . Drug use: No   Family History  Problem Relation Age of Onset  . Heart attack Father   . Diabetes Father   . Heart disease Father        CAD  . Heart disease Other        Strong family hx of CAD  . Sudden death Cousin        ? Cardiac  . Kidney disease Maternal Aunt   . Kidney disease Maternal Uncle   . Kidney  disease Paternal Aunt   . Prostate cancer Paternal Uncle   . Liver disease Paternal Uncle   . Kidney disease Paternal Uncle    Allergies  Allergen Reactions  . Atorvastatin     REACTION: elevated liver functions  . Butenafine      REACTION: burned  . Varenicline Tartrate     REACTION: sleepy   Current Outpatient Medications on File Prior to Visit  Medication Sig Dispense Refill  . Ascorbic Acid (VITAMIN C) 1000 MG tablet Take 1,000 mg by mouth daily.      Marland Kitchen aspirin 81 MG tablet Take 81 mg by mouth daily.      . fish oil-omega-3 fatty acids 1000 MG capsule Take 1 g by mouth daily.      Marland Kitchen lisinopril (PRINIVIL,ZESTRIL) 40 MG tablet TAKE 1 TABLET (40 MG TOTAL) BY MOUTH DAILY. 30 tablet 2  . metoprolol succinate (TOPROL-XL) 25 MG 24 hr tablet TAKE 1 TABLET (25 MG TOTAL) BY MOUTH DAILY. 30 tablet 11  . pantoprazole (PROTONIX) 40 MG tablet TAKE 1 TABLET (40 MG TOTAL) BY MOUTH DAILY. 30 tablet 11   No current facility-administered medications on file prior to visit.     Review of Systems  Constitutional: Positive for fatigue. Negative for activity change, appetite change, fever and unexpected weight change.  HENT: Negative for congestion, rhinorrhea, sore throat and trouble swallowing.   Eyes: Negative for pain, redness, itching and visual disturbance.  Respiratory: Negative for cough, chest tightness, shortness of breath and wheezing.   Cardiovascular: Negative for chest pain and palpitations.  Gastrointestinal: Negative for abdominal pain, blood in stool, constipation, diarrhea and nausea.  Endocrine: Negative for cold intolerance, heat intolerance, polydipsia and polyuria.  Genitourinary: Negative for difficulty urinating, dysuria, frequency and urgency.  Musculoskeletal: Negative for arthralgias, joint swelling and myalgias.  Skin: Negative for pallor and rash.  Neurological: Negative for dizziness, tremors, weakness, numbness and headaches.  Hematological: Negative for adenopathy. Does not bruise/bleed easily.  Psychiatric/Behavioral: Negative for decreased concentration and dysphoric mood. The patient is not nervous/anxious.        Objective:   Physical Exam  Constitutional: He appears well-developed and  well-nourished. No distress.  obese and well appearing   HENT:  Head: Normocephalic and atraumatic.  Right Ear: External ear normal.  Left Ear: External ear normal.  Nose: Nose normal.  Mouth/Throat: Oropharynx is clear and moist.  Eyes: Conjunctivae and EOM are normal. Pupils are equal, round, and reactive to light. Right eye exhibits no discharge. Left eye exhibits no discharge. No scleral icterus.  Neck: Normal range of motion. Neck supple. No JVD present. Carotid bruit is not present. No thyromegaly present.  Cardiovascular: Normal rate, regular rhythm, normal heart sounds and intact distal pulses. Exam reveals no gallop.  Pulmonary/Chest: Effort normal and breath sounds normal. No respiratory distress. He has no wheezes. He exhibits no tenderness.  Abdominal: Soft. Bowel sounds are normal. He exhibits no distension, no abdominal bruit and no mass. There is no tenderness.  Musculoskeletal: He exhibits no edema or tenderness.  Lymphadenopathy:    He has no cervical adenopathy.  Neurological: He is alert. He has normal reflexes. No cranial nerve deficit. He exhibits normal muscle tone. Coordination normal.  Skin: Skin is warm and dry. No rash noted. No erythema. No pallor.  Ruddy complexion  Solar lentigines diffusely   Psychiatric: He has a normal mood and affect.          Assessment & Plan:   Problem List Items Addressed  This Visit      Cardiovascular and Mediastinum   Essential hypertension - Primary    bp in fair control at this time  BP Readings from Last 1 Encounters:  02/05/17 122/76   No changes needed Disc lifstyle change with low sodium diet and exercise  Labs reviewed  Enc wt loss and good lifestyle habits        Relevant Medications   rosuvastatin (CRESTOR) 40 MG tablet   nitroGLYCERIN (NITROSTAT) 0.4 MG SL tablet     Other   HYPERCHOLESTEROLEMIA    Disc goals for lipids and reasons to control them Rev labs with pt Rev low sat fat diet in  detail Good control with crestor and diet  Disc inc exercise for HDL       Relevant Medications   rosuvastatin (CRESTOR) 40 MG tablet   nitroGLYCERIN (NITROSTAT) 0.4 MG SL tablet   Prostate cancer screening    PSA is back to baseline  No symptoms Saw urology this year-  Signed off Continue to follow       PSA elevation    Now improved Back to baseline Saw urology-notes rev  Back to yearly psa here  No symptoms       Routine general medical examination at a health care facility    Reviewed health habits including diet and exercise and skin cancer prevention Reviewed appropriate screening tests for age  Also reviewed health mt list, fam hx and immunization status , as well as social and family history   See HPI Labs reviewed  Enc effort towards wt loss and fitness again  Continue psa yearly  Chronic problems stable Cardiology f/u for CAD Refilled nitro (has not needed)

## 2017-02-05 NOTE — Patient Instructions (Addendum)
Get back to regular exercise - you will feel better !  Think about prioritizing health to get back on track   The Shingrix vaccine is ok after age 59 , from a pharmacy  It may not be available quite yet  Call your pharmacy regarding that and coverage   Labs are fairly stable  Good cholesterol control  Take care of yourself   Continue sun protection and dermatology follow up as needed

## 2017-02-05 NOTE — Assessment & Plan Note (Signed)
PSA is back to baseline  No symptoms Saw urology this year-  Signed off Continue to follow

## 2017-02-05 NOTE — Assessment & Plan Note (Signed)
Now improved Back to baseline Saw urology-notes rev  Back to yearly psa here  No symptoms

## 2017-02-05 NOTE — Assessment & Plan Note (Signed)
bp in fair control at this time  BP Readings from Last 1 Encounters:  02/05/17 122/76   No changes needed Disc lifstyle change with low sodium diet and exercise  Labs reviewed  Enc wt loss and good lifestyle habits

## 2017-02-05 NOTE — Assessment & Plan Note (Signed)
Reviewed health habits including diet and exercise and skin cancer prevention Reviewed appropriate screening tests for age  Also reviewed health mt list, fam hx and immunization status , as well as social and family history   See HPI Labs reviewed  Enc effort towards wt loss and fitness again  Continue psa yearly  Chronic problems stable Cardiology f/u for CAD Refilled nitro (has not needed)

## 2017-02-07 ENCOUNTER — Encounter: Payer: Self-pay | Admitting: Family Medicine

## 2017-03-03 ENCOUNTER — Other Ambulatory Visit: Payer: Self-pay | Admitting: Cardiology

## 2017-03-31 ENCOUNTER — Other Ambulatory Visit: Payer: Self-pay | Admitting: Cardiology

## 2017-04-16 NOTE — Progress Notes (Signed)
HPI: fu CAD; admitted from the office on 04/07/10 with unstable angina. He had Myoview study that day that was abnormal with inferior scar and mild to moderate peri-Infarct ischemia. EF was 48%. Cardiac catheterization demonstrated 30% LAD stenosis and mild plaque in the circumflex. He had a mid 99% and then an ulcerated 80% stenosis in the RCA. This was treated with 2 drug-eluting stents (Promus). He also had a 90% ostial posterior lateral branch stenosis. There are left to right collaterals. EF 55%. Nuclear study November 2016 showed ejection fraction 57% with small prior infarct but no ischemia. Since I last saw him, the patient has dyspnea with more extreme activities but not with routine activities. It is relieved with rest. It is not associated with chest pain. There is no orthopnea, PND or pedal edema. There is no syncope or palpitations. There is no exertional chest pain.   Current Outpatient Medications  Medication Sig Dispense Refill  . Ascorbic Acid (VITAMIN C) 1000 MG tablet Take 1,000 mg by mouth daily.      Marland Kitchen aspirin 81 MG tablet Take 81 mg by mouth daily.      . fish oil-omega-3 fatty acids 1000 MG capsule Take 1 g by mouth daily.      Marland Kitchen lisinopril (PRINIVIL,ZESTRIL) 40 MG tablet TAKE 1 TABLET BY MOUTH EVERY DAY. 30 tablet 0  . metoprolol succinate (TOPROL-XL) 25 MG 24 hr tablet TAKE 1 TABLET (25 MG TOTAL) BY MOUTH DAILY. 30 tablet 11  . nitroGLYCERIN (NITROSTAT) 0.4 MG SL tablet PLACE 1 TABLET UNDER TONGUE EVERY 5 MINS FOR 3 DOSES AS NEEDED 25 tablet 2  . pantoprazole (PROTONIX) 40 MG tablet TAKE 1 TABLET (40 MG TOTAL) BY MOUTH DAILY. 30 tablet 11  . rosuvastatin (CRESTOR) 40 MG tablet Take 1 tablet (40 mg total) by mouth daily. 90 tablet 3   No current facility-administered medications for this visit.      Past Medical History:  Diagnosis Date  . Abnormal nuclear cardiac imaging test 04/07/10   EF 48%; inferior scar; mild-moderate peri-infarct ischemia  . CAD  (coronary artery disease)    s/p Promus DES x 2 RCA 04/08/10;  Cath 2/12: mLAD 30%; CFX mild plaque; mRCA 99% then ulc. 80% tx'd with PCI, dRCA 30%; oPLB 90% (L-R collat); EF 55%  . Colon polyps   . GERD (gastroesophageal reflux disease)   . Hemorrhoids   . Hyperlipidemia   . Hypertension     Past Surgical History:  Procedure Laterality Date  . COLONOSCOPY W/ POLYPECTOMY  2010  . Left heart catheterization  04/09/10  . PTCA  04/09/10    with placement two overlapping drug-eluting stents in the mid right coronary artery.   . TONSILLECTOMY      Social History   Socioeconomic History  . Marital status: Married    Spouse name: Not on file  . Number of children: Not on file  . Years of education: Not on file  . Highest education level: Not on file  Social Needs  . Financial resource strain: Not on file  . Food insecurity - worry: Not on file  . Food insecurity - inability: Not on file  . Transportation needs - medical: Not on file  . Transportation needs - non-medical: Not on file  Occupational History  . Not on file  Tobacco Use  . Smoking status: Former Smoker    Types: Cigarettes    Last attempt to quit: 02/23/2005    Years since quitting:  12.1  . Smokeless tobacco: Never Used  . Tobacco comment: Quit smoking 02/2005.  Substance and Sexual Activity  . Alcohol use: Yes    Alcohol/week: 2.4 oz    Types: 4 Cans of beer per week    Comment: occ  . Drug use: No  . Sexual activity: Not on file  Other Topics Concern  . Not on file  Social History Narrative   Regular exercise at gym.    Family History  Problem Relation Age of Onset  . Heart attack Father   . Diabetes Father   . Heart disease Father        CAD  . Heart disease Other        Strong family hx of CAD  . Sudden death Cousin        ? Cardiac  . Kidney disease Maternal Aunt   . Kidney disease Maternal Uncle   . Kidney disease Paternal Aunt   . Prostate cancer Paternal Uncle   . Liver disease Paternal Uncle    . Kidney disease Paternal Uncle     ROS: no fevers or chills, productive cough, hemoptysis, dysphasia, odynophagia, melena, hematochezia, dysuria, hematuria, rash, seizure activity, orthopnea, PND, pedal edema, claudication. Remaining systems are negative.  Physical Exam: Well-developed well-nourished in no acute distress.  Skin is warm and dry.  HEENT is normal.  Neck is supple.  Chest is clear to auscultation with normal expansion.  Cardiovascular exam is regular rate and rhythm.  Abdominal exam nontender or distended. No masses palpated. Extremities show no edema. neuro grossly intact  ECG-sinus rhythm at a rate of 97.  No ST changes.  Personally reviewed  A/P  1 coronary artery disease-is doing well from a cardiac standpoint.  No chest pain.  Plan to continue medical therapy.  Continue aspirin and statin.  Last nuclear study low risk.  2 hypertension-blood pressure is borderline.  Increase Toprol to 50 mg daily and follow.  3 hyperlipidemia-continue statin.    Kirk Ruths, MD

## 2017-04-26 ENCOUNTER — Ambulatory Visit: Payer: BLUE CROSS/BLUE SHIELD | Admitting: Cardiology

## 2017-04-26 ENCOUNTER — Encounter: Payer: Self-pay | Admitting: Cardiology

## 2017-04-26 VITALS — BP 138/84 | HR 97 | Ht 70.0 in | Wt 249.0 lb

## 2017-04-26 DIAGNOSIS — I2510 Atherosclerotic heart disease of native coronary artery without angina pectoris: Secondary | ICD-10-CM

## 2017-04-26 DIAGNOSIS — I10 Essential (primary) hypertension: Secondary | ICD-10-CM

## 2017-04-26 DIAGNOSIS — E7800 Pure hypercholesterolemia, unspecified: Secondary | ICD-10-CM

## 2017-04-26 MED ORDER — METOPROLOL SUCCINATE ER 50 MG PO TB24: 50.0000 mg | ORAL_TABLET | Freq: Every day | ORAL | 3 refills | Status: DC

## 2017-04-26 MED ORDER — LISINOPRIL 40 MG PO TABS: 40.0000 mg | ORAL_TABLET | Freq: Every day | ORAL | 3 refills | Status: DC

## 2017-04-26 NOTE — Patient Instructions (Signed)
Medication Instructions:   INCREASE METOPROLOL TO 50 MG ONCE DAILY= 2 OF THE 25 MG TABLETS ONCE DAILY  Follow-Up:  Your physician wants you to follow-up in: [place] will receive a reminder letter in the mail two months in advance. If you don't receive a letter, please call our office to schedule the follow-up appointment.   If you need a refill on your cardiac medications before your next appointment, please call your pharmacy.

## 2017-06-06 IMAGING — DX DG ABDOMEN 1V
1 series · 1 of 1 positions shown · non-contrast
Comparison: None

CLINICAL DATA: Acute left flank pain.  No trauma.

EXAM:
ABDOMEN - 1 VIEW

[abdomen kub]
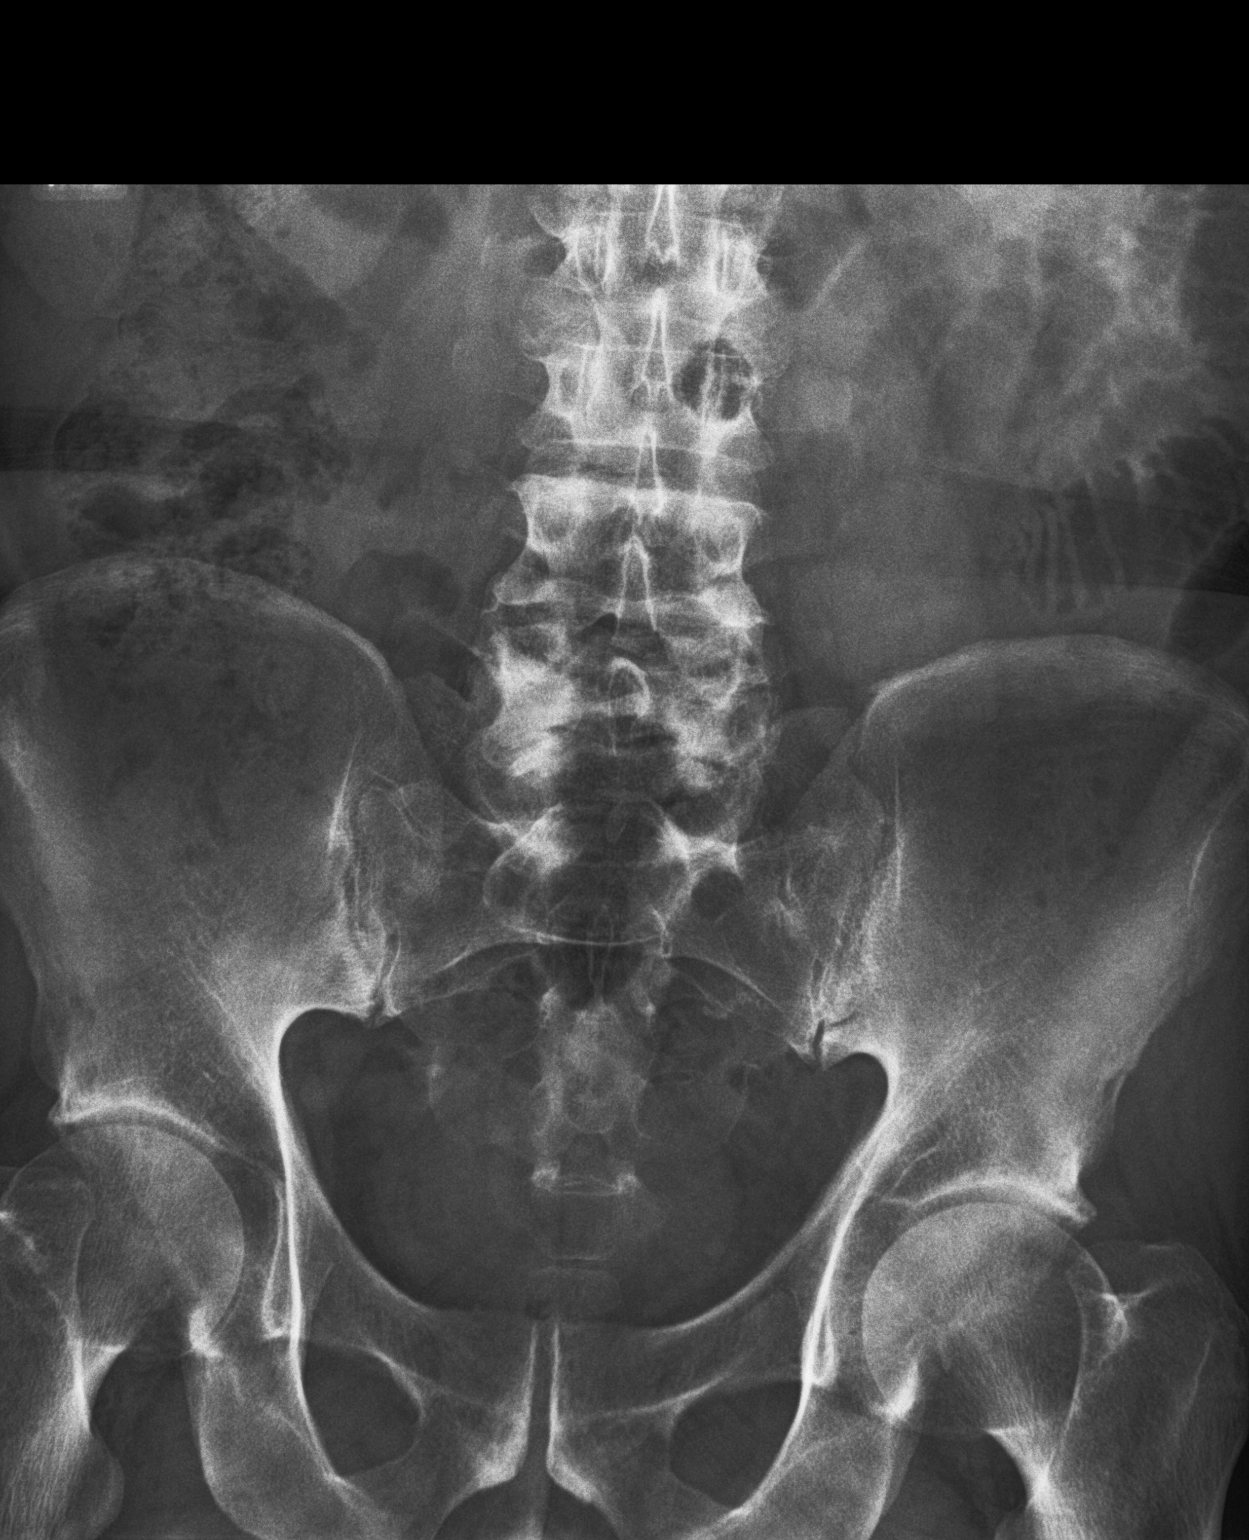

[1 of 1 positions shown; findings below may reference images not displayed]

FINDINGS: There is a mildly prominent loop of small bowel within the left
central abdomen consistent with focal ileus or partial small bowel
obstruction. No evidence for organomegaly or abnormal
calcifications. Visualized osseous structures have a normal
appearance.
IMPRESSION: Question of focal ileus versus early small bowel obstruction.

## 2017-09-20 ENCOUNTER — Ambulatory Visit: Payer: BLUE CROSS/BLUE SHIELD | Admitting: Family Medicine

## 2017-09-20 ENCOUNTER — Encounter: Payer: Self-pay | Admitting: Family Medicine

## 2017-09-20 VITALS — BP 146/82 | HR 99 | Temp 98.2°F | Ht 70.0 in | Wt 245.5 lb

## 2017-09-20 DIAGNOSIS — J069 Acute upper respiratory infection, unspecified: Secondary | ICD-10-CM

## 2017-09-20 MED ORDER — AMOXICILLIN-POT CLAVULANATE 875-125 MG PO TABS: 1.0000 | ORAL_TABLET | Freq: Two times a day (BID) | ORAL | 0 refills | Status: DC

## 2017-09-20 NOTE — Assessment & Plan Note (Signed)
Over 3 weeks - improved and then worsened  Worrisome for early sinusitis / bronchitis Given length of illness- cover with augmentin  Fluids/rest sympt care  Disc symptomatic care - see instructions on AVS  Update if not starting to improve in a week or if worsening

## 2017-09-20 NOTE — Progress Notes (Signed)
Subjective:    Patient ID: Richard Baldwin, male    DOB: [birth year], 62 y.o.   MRN: [id number]  HPI Here for uri symptoms incl cough and nasal congestion   Wt Readings from Last 3 Encounters:  09/20/17 245 lb 8 oz (111.4 kg)  04/26/17 249 lb (112.9 kg)  02/05/17 246 lb 4 oz (111.7 kg)   35.23 kg/m   Symptoms started 3 wk ago  Scratchy throat  Then cough-green phlegm pnd and congestion/nasal -green d/c  Got a bit better and then worse   Traveling a lot for work   Friday- worse No facial pain  Some facial pressure - that improved a bit    mucinex  Delsym  Saline spray prn   No ear pain  No wheeze Throat is improved / feels rough - and a little hoarse   No fever or chills/aches  Patient Active Problem List   Diagnosis Date Noted  . URI with cough and congestion 09/20/2017  . PSA elevation 02/04/2016  . Flank pain 11/10/2015  . Colon cancer screening 01/25/2015  . Routine general medical examination at a health care facility 12/08/2010  . Prostate cancer screening 12/08/2010  . CAD (coronary artery disease)   . UNSTABLE ANGINA 04/07/2010  . ABNORMAL EKG 03/04/2010  . HYPERCHOLESTEROLEMIA 05/28/2006  . Essential hypertension 05/28/2006  . HEMORRHOIDS, WITH BLEEDING 05/28/2006  . GERD 05/28/2006  . TOBACCO USE, QUIT 05/28/2006   Past Medical History:  Diagnosis Date  . Abnormal nuclear cardiac imaging test 04/07/10   EF 48%; inferior scar; mild-moderate peri-infarct ischemia  . CAD (coronary artery disease)    s/p Promus DES x 2 RCA 04/08/10;  Cath 2/12: mLAD 30%; CFX mild plaque; mRCA 99% then ulc. 80% tx'd with PCI, dRCA 30%; oPLB 90% (L-R collat); EF 55%  . Colon polyps   . GERD (gastroesophageal reflux disease)   . Hemorrhoids   . Hyperlipidemia   . Hypertension    Past Surgical History:  Procedure Laterality Date  . COLONOSCOPY W/ POLYPECTOMY  2010  . Left heart catheterization  04/09/10  . PTCA  04/09/10    with placement two overlapping  drug-eluting stents in the mid right coronary artery.   . TONSILLECTOMY     Social History   Tobacco Use  . Smoking status: Former Smoker    Types: Cigarettes    Last attempt to quit: 02/23/2005    Years since quitting: 12.5  . Smokeless tobacco: Never Used  . Tobacco comment: Quit smoking 02/2005.  Substance Use Topics  . Alcohol use: Yes    Alcohol/week: 2.4 oz    Types: 4 Cans of beer per week    Comment: occ  . Drug use: No   Family History  Problem Relation Age of Onset  . Heart attack Father   . Diabetes Father   . Heart disease Father        CAD  . Heart disease Other        Strong family hx of CAD  . Sudden death Cousin        ? Cardiac  . Kidney disease Maternal Aunt   . Kidney disease Maternal Uncle   . Kidney disease Paternal Aunt   . Prostate cancer Paternal Uncle   . Liver disease Paternal Uncle   . Kidney disease Paternal Uncle    Allergies  Allergen Reactions  . Atorvastatin     REACTION: elevated liver functions  . Butenafine     REACTION: burned  .  Varenicline Tartrate     REACTION: sleepy   Current Outpatient Medications on File Prior to Visit  Medication Sig Dispense Refill  . Ascorbic Acid (VITAMIN C) 1000 MG tablet Take 1,000 mg by mouth daily.      Marland Kitchen aspirin 81 MG tablet Take 81 mg by mouth daily.      . fish oil-omega-3 fatty acids 1000 MG capsule Take 1 g by mouth daily.      Marland Kitchen lisinopril (PRINIVIL,ZESTRIL) 40 MG tablet Take 1 tablet (40 mg total) by mouth daily. 90 tablet 3  . metoprolol succinate (TOPROL-XL) 50 MG 24 hr tablet Take 1 tablet (50 mg total) by mouth daily. 90 tablet 3  . nitroGLYCERIN (NITROSTAT) 0.4 MG SL tablet PLACE 1 TABLET UNDER TONGUE EVERY 5 MINS FOR 3 DOSES AS NEEDED 25 tablet 2  . pantoprazole (PROTONIX) 40 MG tablet TAKE 1 TABLET (40 MG TOTAL) BY MOUTH DAILY. 30 tablet 11  . rosuvastatin (CRESTOR) 40 MG tablet Take 1 tablet (40 mg total) by mouth daily. 90 tablet 3   No current facility-administered medications on  file prior to visit.      Review of Systems  Constitutional: Positive for appetite change and fatigue. Negative for fever.  HENT: Positive for congestion, postnasal drip, rhinorrhea, sinus pressure, sneezing and sore throat. Negative for ear pain and sinus pain.   Eyes: Negative for pain and discharge.  Respiratory: Positive for cough. Negative for shortness of breath, wheezing and stridor.   Cardiovascular: Negative for chest pain.  Gastrointestinal: Negative for diarrhea, nausea and vomiting.  Genitourinary: Negative for frequency, hematuria and urgency.  Musculoskeletal: Negative for arthralgias and myalgias.  Skin: Negative for rash.  Neurological: Positive for headaches. Negative for dizziness, weakness and light-headedness.  Psychiatric/Behavioral: Negative for confusion and dysphoric mood.       Objective:   Physical Exam  Constitutional: He appears well-developed and well-nourished. No distress.  overwt and well appearing   HENT:  Head: Normocephalic and atraumatic.  Right Ear: External ear normal.  Left Ear: External ear normal.  Mouth/Throat: Oropharynx is clear and moist.  Nares are injected and congested  No sinus tenderness Clear rhinorrhea and post nasal drip   Eyes: Pupils are equal, round, and reactive to light. Conjunctivae and EOM are normal. Right eye exhibits no discharge. Left eye exhibits no discharge.  Neck: Normal range of motion. Neck supple.  Cardiovascular: Normal rate and normal heart sounds.  Pulmonary/Chest: Effort normal and breath sounds normal. No stridor. No respiratory distress. He has no wheezes. He has no rales. He exhibits no tenderness.  Good air exch No wheeze/even on forced expiration  Some upper airway sounds   Lymphadenopathy:    He has no cervical adenopathy.  Neurological: He is alert. No cranial nerve deficit.  Skin: Skin is warm and dry. No rash noted.  Psychiatric: He has a normal mood and affect.          Assessment &  Plan:   Problem List Items Addressed This Visit      Respiratory   URI with cough and congestion - Primary    Over 3 weeks - improved and then worsened  Worrisome for early sinusitis / bronchitis Given length of illness- cover with augmentin  Fluids/rest sympt care  Disc symptomatic care - see instructions on AVS  Update if not starting to improve in a week or if worsening

## 2017-09-20 NOTE — Patient Instructions (Signed)
Try to get rest when you can  Drink lots of fluids Continue mucinex /delsym as needed Also saline nasal spray   Take the augmentin as directed  Update if not starting to improve in a week or if worsening

## 2017-11-05 ENCOUNTER — Encounter: Payer: Self-pay | Admitting: Family Medicine

## 2017-11-05 ENCOUNTER — Ambulatory Visit: Payer: BLUE CROSS/BLUE SHIELD | Admitting: Family Medicine

## 2017-11-05 VITALS — BP 126/80 | HR 70 | Temp 98.2°F | Ht 70.0 in | Wt 246.0 lb

## 2017-11-05 DIAGNOSIS — M545 Low back pain: Secondary | ICD-10-CM | POA: Diagnosis not present

## 2017-11-05 LAB — POC URINALSYSI DIPSTICK (AUTOMATED)
Bilirubin, UA: NEGATIVE
Blood, UA: NEGATIVE
Glucose, UA: NEGATIVE
KETONES UA: NEGATIVE
LEUKOCYTES UA: NEGATIVE
NITRITE UA: NEGATIVE
PROTEIN UA: POSITIVE — AB
Spec Grav, UA: >=1.03 — AB (ref 1.010–1.025)
UROBILINOGEN UA: 1 U/dL
pH, UA: 6 (ref 5.0–8.0)

## 2017-11-05 MED ORDER — MELOXICAM 15 MG PO TABS: 15.0000 mg | ORAL_TABLET | Freq: Every day | ORAL | 1 refills | Status: AC | PRN

## 2017-11-05 MED ORDER — CYCLOBENZAPRINE HCL 10 MG PO TABS: 10.0000 mg | ORAL_TABLET | Freq: Every evening | ORAL | 1 refills | Status: DC | PRN

## 2017-11-05 NOTE — Patient Instructions (Signed)
Take the meloxicam with food daily for 2 weeks and then as needed  Use the flexeril (muscle relaxer) at night (it will sedate)  Use heat when you can for 10 minutes at a time  Start the stretches and exercises - if something hurts- skip that one   Walk when you can   When you sleep on your side - put a pillow between your knees   Update [place] if no improvement in 2-3 weeks or if worse

## 2017-11-05 NOTE — Progress Notes (Signed)
Subjective:    Patient ID: Richard Baldwin, male    DOB: 04/24/1955, 62 y.o.   MRN: [id number]  HPI Here for back pain   Wt Readings from Last 3 Encounters:  11/05/17 246 lb (111.6 kg)  09/20/17 245 lb 8 oz (111.4 kg)  04/26/17 249 lb (112.9 kg)   35.30 kg/m   Had some back pain - mid to low early in the year Saw chiropractor  Gradually improved with multiple visits   Last 6-8 weeks- sporatic pain /on and off  Affects his gait  occ takes tylenol or ibuprofen   Different beds and office chairs -travels for work  When he does exercise- elliptical or walking   Pain is dull and achey  All the way across low back (not worse on one side)  No n/t or weakness Walking exacerbates it  No one position helps in bed  Stiff after inactivity   Has never had home exercises   Now pain occ radiates down R leg (driving)  occ muscle spasm in either leg at night    Results for orders placed or performed in visit on 11/05/17  POCT Urinalysis Dipstick (Automated)  Result Value Ref Range   Color, UA Yellow    Clarity, UA Clear    Glucose, UA Negative Negative   Bilirubin, UA Negative    Ketones, UA Negative    Spec Grav, UA >=1.030 (A) 1.010 - 1.025   Blood, UA Negative    pH, UA 6.0 5.0 - 8.0   Protein, UA Positive (A) Negative   Urobilinogen, UA 1.0 0.2 or 1.0 E.U./dL   Nitrite, UA Negative    Leukocytes, UA Negative Negative     Patient Active Problem List   Diagnosis Date Noted  . URI with cough and congestion 09/20/2017  . PSA elevation 02/04/2016  . Flank pain 11/10/2015  . Colon cancer screening 01/25/2015  . Low back pain 06/22/2014  . Routine general medical examination at a health care facility 12/08/2010  . Prostate cancer screening 12/08/2010  . CAD (coronary artery disease)   . UNSTABLE ANGINA 04/07/2010  . ABNORMAL EKG 03/04/2010  . HYPERCHOLESTEROLEMIA 05/28/2006  . Essential hypertension 05/28/2006  . HEMORRHOIDS, WITH BLEEDING 05/28/2006  .  GERD 05/28/2006  . TOBACCO USE, QUIT 05/28/2006   Past Medical History:  Diagnosis Date  . Abnormal nuclear cardiac imaging test 04/07/10   EF 48%; inferior scar; mild-moderate peri-infarct ischemia  . CAD (coronary artery disease)    s/p Promus DES x 2 RCA 04/08/10;  Cath 2/12: mLAD 30%; CFX mild plaque; mRCA 99% then ulc. 80% tx'd with PCI, dRCA 30%; oPLB 90% (L-R collat); EF 55%  . Colon polyps   . GERD (gastroesophageal reflux disease)   . Hemorrhoids   . Hyperlipidemia   . Hypertension    Past Surgical History:  Procedure Laterality Date  . COLONOSCOPY W/ POLYPECTOMY  2010  . Left heart catheterization  04/09/10  . PTCA  04/09/10    with placement two overlapping drug-eluting stents in the mid right coronary artery.   . TONSILLECTOMY     Social History   Tobacco Use  . Smoking status: Former Smoker    Types: Cigarettes    Last attempt to quit: 02/23/2005    Years since quitting: 12.7  . Smokeless tobacco: Never Used  . Tobacco comment: Quit smoking 02/2005.  Substance Use Topics  . Alcohol use: Yes    Alcohol/week: 4.0 standard drinks    Types: 4 Cans  of beer per week    Comment: occ  . Drug use: No   Family History  Problem Relation Age of Onset  . Heart attack Father   . Diabetes Father   . Heart disease Father        CAD  . Heart disease Other        Strong family hx of CAD  . Sudden death Cousin        ? Cardiac  . Kidney disease Maternal Aunt   . Kidney disease Maternal Uncle   . Kidney disease Paternal Aunt   . Prostate cancer Paternal Uncle   . Liver disease Paternal Uncle   . Kidney disease Paternal Uncle    Allergies  Allergen Reactions  . Atorvastatin     REACTION: elevated liver functions  . Butenafine     REACTION: burned  . Varenicline Tartrate     REACTION: sleepy   Current Outpatient Medications on File Prior to Visit  Medication Sig Dispense Refill  . Ascorbic Acid (VITAMIN C) 1000 MG tablet Take 1,000 mg by mouth daily.      Marland Kitchen  aspirin 81 MG tablet Take 81 mg by mouth daily.      . fish oil-omega-3 fatty acids 1000 MG capsule Take 1 g by mouth daily.      Marland Kitchen lisinopril (PRINIVIL,ZESTRIL) 40 MG tablet Take 1 tablet (40 mg total) by mouth daily. 90 tablet 3  . metoprolol succinate (TOPROL-XL) 50 MG 24 hr tablet Take 1 tablet (50 mg total) by mouth daily. 90 tablet 3  . nitroGLYCERIN (NITROSTAT) 0.4 MG SL tablet PLACE 1 TABLET UNDER TONGUE EVERY 5 MINS FOR 3 DOSES AS NEEDED 25 tablet 2  . pantoprazole (PROTONIX) 40 MG tablet TAKE 1 TABLET (40 MG TOTAL) BY MOUTH DAILY. 30 tablet 11  . rosuvastatin (CRESTOR) 40 MG tablet Take 1 tablet (40 mg total) by mouth daily. 90 tablet 3   No current facility-administered medications on file prior to visit.     Review of Systems  Constitutional: Negative for activity change, appetite change, fatigue, fever and unexpected weight change.  HENT: Negative for congestion, rhinorrhea, sore throat and trouble swallowing.   Eyes: Negative for pain, redness, itching and visual disturbance.  Respiratory: Negative for cough, chest tightness, shortness of breath and wheezing.   Cardiovascular: Negative for chest pain and palpitations.  Gastrointestinal: Negative for abdominal pain, blood in stool, constipation, diarrhea and nausea.  Endocrine: Negative for cold intolerance, heat intolerance, polydipsia and polyuria.  Genitourinary: Negative for difficulty urinating, dysuria, frequency and urgency.  Musculoskeletal: Positive for back pain. Negative for arthralgias, joint swelling and myalgias.  Skin: Negative for pallor and rash.  Neurological: Negative for dizziness, tremors, weakness, numbness and headaches.  Hematological: Negative for adenopathy. Does not bruise/bleed easily.  Psychiatric/Behavioral: Negative for decreased concentration and dysphoric mood. The patient is not nervous/anxious.        Objective:   Physical Exam  Constitutional: He appears well-developed and well-nourished.  No distress.  obese and well appearing   HENT:  Head: Normocephalic and atraumatic.  Eyes: Pupils are equal, round, and reactive to light. Conjunctivae and EOM are normal. No scleral icterus.  Neck: Normal range of motion. Neck supple.  Cardiovascular: Normal rate and regular rhythm.  Pulmonary/Chest: Effort normal and breath sounds normal. He has no wheezes. He has no rales.  Abdominal: Soft. Bowel sounds are normal. He exhibits no distension. There is no tenderness.  Musculoskeletal: He exhibits tenderness.  Right shoulder: He exhibits decreased range of motion, tenderness and spasm. He exhibits no bony tenderness, no swelling, no effusion, no crepitus, no deformity, normal pulse and normal strength.       Lumbar back: He exhibits decreased range of motion, tenderness and spasm. He exhibits no bony tenderness and no edema.  Mild loss of lordosis in LS  Tender R peri lumbar musculature with some spasm  No rash/skin change Pain on L lateral bend and full ext Otherwise full rom  Favors R foot with gait  No foot drop  No neuro changes  Nl rom hips   Lymphadenopathy:    He has no cervical adenopathy.  Neurological: He is alert. He has normal strength and normal reflexes. He displays no atrophy. No cranial nerve deficit or sensory deficit. He exhibits normal muscle tone. Coordination normal.  Negative SLR  Skin: Skin is warm and dry. No rash noted. No erythema. No pallor.  Ruddy complexion  Psychiatric: He has a normal mood and affect.          Assessment & Plan:   Problem List Items Addressed This Visit      Other   Low back pain - Primary    Recurrent  R lower back -imp with chiropractic tx in the past No neuro symptoms  Spasm on exam  Px meloxicam daily for 2 wk  Flexeril prn  Disc sleep pos on side-pillow between knees  Handouts re: back pain and rehab and body mechanics  Consider PT/imaging if no imp in 2 wk      Relevant Medications   meloxicam (MOBIC) 15 MG  tablet   cyclobenzaprine (FLEXERIL) 10 MG tablet   Other Relevant Orders   POCT Urinalysis Dipstick (Automated) (Completed)

## 2017-11-06 NOTE — Assessment & Plan Note (Signed)
Recurrent  R lower back -imp with chiropractic tx in the past No neuro symptoms  Spasm on exam  Px meloxicam daily for 2 wk  Flexeril prn  Disc sleep pos on side-pillow between knees  Handouts re: back pain and rehab and body mechanics  Consider PT/imaging if no imp in 2 wk

## 2017-11-18 ENCOUNTER — Ambulatory Visit: Payer: BLUE CROSS/BLUE SHIELD

## 2017-11-22 ENCOUNTER — Other Ambulatory Visit: Payer: Self-pay | Admitting: Cardiology

## 2017-11-24 ENCOUNTER — Ambulatory Visit: Payer: BLUE CROSS/BLUE SHIELD | Admitting: Family Medicine

## 2017-11-24 ENCOUNTER — Other Ambulatory Visit: Payer: Self-pay | Admitting: *Deleted

## 2017-11-24 ENCOUNTER — Encounter: Payer: Self-pay | Admitting: Family Medicine

## 2017-11-24 VITALS — BP 136/94 | HR 96 | Temp 98.1°F | Ht 70.0 in | Wt 243.5 lb

## 2017-11-24 DIAGNOSIS — R197 Diarrhea, unspecified: Secondary | ICD-10-CM

## 2017-11-24 DIAGNOSIS — Z23 Encounter for immunization: Secondary | ICD-10-CM | POA: Diagnosis not present

## 2017-11-24 LAB — CBC WITH DIFFERENTIAL/PLATELET
BASOS PCT: 0.4 % (ref 0.0–3.0)
Basophils Absolute: 0 10*3/uL (ref 0.0–0.1)
EOS ABS: 0.2 10*3/uL (ref 0.0–0.7)
Eosinophils Relative: 1.9 % (ref 0.0–5.0)
HEMATOCRIT: 42 % (ref 39.0–52.0)
HEMOGLOBIN: 14.5 g/dL (ref 13.0–17.0)
LYMPHS PCT: 14.9 % (ref 12.0–46.0)
Lymphs Abs: 1.4 10*3/uL (ref 0.7–4.0)
MCHC: 34.6 g/dL (ref 30.0–36.0)
MCV: 90.2 fl (ref 78.0–100.0)
MONO ABS: 1 10*3/uL (ref 0.1–1.0)
Monocytes Relative: 10.8 % (ref 3.0–12.0)
NEUTROS PCT: 72 % (ref 43.0–77.0)
Neutro Abs: 6.8 10*3/uL (ref 1.4–7.7)
PLATELETS: 238 10*3/uL (ref 150.0–400.0)
RBC: 4.65 Mil/uL (ref 4.22–5.81)
RDW: 12.8 % (ref 11.5–15.5)
WBC: 9.4 10*3/uL (ref 4.0–10.5)

## 2017-11-24 LAB — COMPREHENSIVE METABOLIC PANEL
ALT: 27 U/L (ref 0–53)
AST: 19 U/L (ref 0–37)
Albumin: 4.4 g/dL (ref 3.5–5.2)
Alkaline Phosphatase: 46 U/L (ref 39–117)
BUN: 26 mg/dL — ABNORMAL HIGH (ref 6–23)
CO2: 26 meq/L (ref 19–32)
Calcium: 9.8 mg/dL (ref 8.4–10.5)
Chloride: 103 mEq/L (ref 96–112)
Creatinine, Ser: 1.05 mg/dL (ref 0.40–1.50)
GFR: 76.64 mL/min (ref >60.00)
GLUCOSE: 102 mg/dL — AB (ref 70–99)
POTASSIUM: 4.2 meq/L (ref 3.5–5.1)
Sodium: 138 mEq/L (ref 135–145)
Total Bilirubin: 0.7 mg/dL (ref 0.2–1.2)
Total Protein: 7.2 g/dL (ref 6.0–8.3)

## 2017-11-24 MED ORDER — OMEPRAZOLE 20 MG PO CPDR: 20.0000 mg | DELAYED_RELEASE_CAPSULE | Freq: Every day | ORAL | 3 refills | Status: DC

## 2017-11-24 NOTE — Patient Instructions (Signed)
Keep drinking lots of fluids  Eat bland   (the brat diet is bananas, apple sauce, rice, toast) for example  Nothing fatty or heavy   Labs and stool tests   Avoid diarrhea medication medication for now (a little pepto is all I want to try)   Update [place] if symptoms suddenly change or worsen

## 2017-11-24 NOTE — Progress Notes (Signed)
Subjective:    Patient ID: Richard Baldwin, male    DOB: Sep 22, 1955, 62 y.o.   MRN: [id number]  HPI Here for possible colitis flare up   Last Monday- was traveling to [facility] a lunchable pack with chips and soda  Then retuned to hotel that night- vomiting and diarrhea  Some improvement the next am  Tired  Was able to eat light that week Got home- ate burger and beer  Sat am - woke up with terrible pain - had bm (diarrhea) - very little blood -thinks from hemorrhoids  Took it easy- ate light but could not keep anything down sat  Sunday - similar (but did not have to get up in the night)   Yesterday was able to eat meals 4:30 this am - diarrhea (liquid to soft) -not solid at all   Overall does not feel that bad   Had urgency of BMs (a lot of pressure)   Able to eat grilled chicken and some salad  No abd pain /just pressure when he needs to have bm   No antibiotics lately  Holding vit C  No hosp or nursing home visits      Wt Readings from Last 3 Encounters:  11/24/17 243 lb 8 oz (110.5 kg)  11/05/17 246 lb (111.6 kg)  09/20/17 245 lb 8 oz (111.4 kg)   34.94 kg/m   Temp: 98.1 F (36.7 C)   Pt had episode of severe diarrhea in 2014 Saw GI c diff neg  His colonoscopy was consistent with nonspecific colitis  (poss post infectious)?  Happened after abx for dental abx He took lialda 2.4 mg daily and improved  Patient Active Problem List   Diagnosis Date Noted  . PSA elevation 02/04/2016  . Flank pain 11/10/2015  . Colon cancer screening 01/25/2015  . Low back pain 06/22/2014  . Diarrhea 07/15/2012  . Routine general medical examination at a health care facility 12/08/2010  . Prostate cancer screening 12/08/2010  . CAD (coronary artery disease)   . UNSTABLE ANGINA 04/07/2010  . ABNORMAL EKG 03/04/2010  . HYPERCHOLESTEROLEMIA 05/28/2006  . Essential hypertension 05/28/2006  . HEMORRHOIDS, WITH BLEEDING 05/28/2006  . GERD 05/28/2006  .  TOBACCO USE, QUIT 05/28/2006   Past Medical History:  Diagnosis Date  . Abnormal nuclear cardiac imaging test 04/07/10   EF 48%; inferior scar; mild-moderate peri-infarct ischemia  . CAD (coronary artery disease)    s/p Promus DES x 2 RCA 04/08/10;  Cath 2/12: mLAD 30%; CFX mild plaque; mRCA 99% then ulc. 80% tx'd with PCI, dRCA 30%; oPLB 90% (L-R collat); EF 55%  . Colon polyps   . GERD (gastroesophageal reflux disease)   . Hemorrhoids   . Hyperlipidemia   . Hypertension    Past Surgical History:  Procedure Laterality Date  . COLONOSCOPY W/ POLYPECTOMY  2010  . Left heart catheterization  04/09/10  . PTCA  04/09/10    with placement two overlapping drug-eluting stents in the mid right coronary artery.   . TONSILLECTOMY     Social History   Tobacco Use  . Smoking status: Former Smoker    Types: Cigarettes    Last attempt to quit: 02/23/2005    Years since quitting: 12.7  . Smokeless tobacco: Never Used  . Tobacco comment: Quit smoking 02/2005.  Substance Use Topics  . Alcohol use: Yes    Alcohol/week: 4.0 standard drinks    Types: 4 Cans of beer per week    Comment:  occ  . Drug use: No   Family History  Problem Relation Age of Onset  . Heart attack Father   . Diabetes Father   . Heart disease Father        CAD  . Heart disease Other        Strong family hx of CAD  . Sudden death Cousin        ? Cardiac  . Kidney disease Maternal Aunt   . Kidney disease Maternal Uncle   . Kidney disease Paternal Aunt   . Prostate cancer Paternal Uncle   . Liver disease Paternal Uncle   . Kidney disease Paternal Uncle    Allergies  Allergen Reactions  . Atorvastatin     REACTION: elevated liver functions  . Butenafine     REACTION: burned  . Varenicline Tartrate     REACTION: sleepy   Current Outpatient Medications on File Prior to Visit  Medication Sig Dispense Refill  . Ascorbic Acid (VITAMIN C) 1000 MG tablet Take 1,000 mg by mouth daily.      Marland Kitchen aspirin 81 MG tablet Take  81 mg by mouth daily.      . cyclobenzaprine (FLEXERIL) 10 MG tablet Take 1 tablet (10 mg total) by mouth at bedtime as needed for muscle spasms (back pain). 30 tablet 1  . fish oil-omega-3 fatty acids 1000 MG capsule Take 1 g by mouth daily.      Marland Kitchen lisinopril (PRINIVIL,ZESTRIL) 40 MG tablet Take 1 tablet (40 mg total) by mouth daily. 90 tablet 3  . meloxicam (MOBIC) 15 MG tablet Take 1 tablet (15 mg total) by mouth daily as needed for pain. With food 30 tablet 1  . metoprolol succinate (TOPROL-XL) 25 MG 24 hr tablet TAKE 1 TABLET (25 MG TOTAL) BY MOUTH DAILY. 90 tablet 1  . metoprolol succinate (TOPROL-XL) 50 MG 24 hr tablet Take 1 tablet (50 mg total) by mouth daily. 90 tablet 3  . nitroGLYCERIN (NITROSTAT) 0.4 MG SL tablet PLACE 1 TABLET UNDER TONGUE EVERY 5 MINS FOR 3 DOSES AS NEEDED 25 tablet 2  . rosuvastatin (CRESTOR) 40 MG tablet Take 1 tablet (40 mg total) by mouth daily. 90 tablet 3   No current facility-administered medications on file prior to visit.      Review of Systems  Constitutional: Positive for fatigue. Negative for activity change, appetite change, fever and unexpected weight change.  HENT: Negative for congestion, rhinorrhea, sore throat and trouble swallowing.   Eyes: Negative for pain, redness, itching and visual disturbance.  Respiratory: Negative for cough, chest tightness, shortness of breath and wheezing.   Cardiovascular: Negative for chest pain and palpitations.  Gastrointestinal: Positive for diarrhea. Negative for abdominal distention, abdominal pain, anal bleeding, blood in stool, constipation, nausea and vomiting.       N/v is resolved   Endocrine: Negative for cold intolerance, heat intolerance, polydipsia and polyuria.  Genitourinary: Negative for difficulty urinating, dysuria, frequency and urgency.  Musculoskeletal: Negative for arthralgias, joint swelling and myalgias.  Skin: Negative for pallor and rash.  Neurological: Negative for dizziness, tremors,  weakness, light-headedness, numbness and headaches.  Hematological: Negative for adenopathy. Does not bruise/bleed easily.  Psychiatric/Behavioral: Negative for decreased concentration and dysphoric mood. The patient is not nervous/anxious.        Objective:   Physical Exam  Constitutional: He appears well-developed and well-nourished. No distress.  overwt and well appearing   HENT:  Head: Normocephalic and atraumatic.  Mouth/Throat: Oropharynx is clear and moist.  Eyes:  Pupils are equal, round, and reactive to light. Conjunctivae and EOM are normal. No scleral icterus.  Neck: Normal range of motion. Neck supple.  Cardiovascular: Normal rate, regular rhythm and normal heart sounds.  Pulmonary/Chest: Effort normal and breath sounds normal. No respiratory distress. He has no wheezes. He has no rales.  Abdominal: Soft. Bowel sounds are normal. He exhibits no distension, no abdominal bruit and no mass. There is no hepatosplenomegaly. There is no tenderness. There is no rigidity, no rebound, no guarding, no CVA tenderness, no tenderness at McBurney's point and negative Murphy's sign.  Musculoskeletal: He exhibits no edema.  Lymphadenopathy:    He has no cervical adenopathy.  Neurological: He is alert.  Skin: Skin is warm and dry. No erythema. No pallor.  Psychiatric: He has a normal mood and affect.          Assessment & Plan:   Problem List Items Addressed This Visit      Other   Diarrhea - Primary    In pt with remote hx of post infectious colitis (rev endo 2014)   Episodic n/v-resolved but diarrhea persists  Diff incl viral gastroenteritis/ food bourne dz or colitis Disc imp of hydration Stick with bland diet -rev  Avoid anti diarrhea med except for occ pepto if needed  Stool cx and c diff test  bmet and cbc today  Plan to follow   Update if not starting to improve in a week or if worsening  -esp if abd pain or any fever or blood in stool      Relevant Orders   CBC  with Differential/Platelet (Completed)   Comprehensive metabolic panel (Completed)   C. difficile GDH and Toxin A/B   Stool culture    Other Visit Diagnoses    Need for influenza vaccination       Relevant Orders   Flu Vaccine QUAD 6+ mos PF IM (Fluarix Quad PF) (Completed)

## 2017-11-24 NOTE — Assessment & Plan Note (Signed)
In pt with remote hx of post infectious colitis (rev endo 2014)   Episodic n/v-resolved but diarrhea persists  Diff incl viral gastroenteritis/ food bourne dz or colitis Disc imp of hydration Stick with bland diet -rev  Avoid anti diarrhea med except for occ pepto if needed  Stool cx and c diff test  bmet and cbc today  Plan to follow   Update if not starting to improve in a week or if worsening  -esp if abd pain or any fever or blood in stool

## 2017-11-25 ENCOUNTER — Ambulatory Visit: Payer: BLUE CROSS/BLUE SHIELD

## 2017-11-25 ENCOUNTER — Other Ambulatory Visit: Payer: Self-pay | Admitting: Cardiology

## 2017-11-25 NOTE — Telephone Encounter (Signed)
The source prescription was discontinued on 11/24/2017 by Cristopher Estimable, RN for the following reason: Cost of medication.

## 2017-11-28 LAB — STOOL CULTURE
MICRO NUMBER: 91185290
MICRO NUMBER:: 91185288
MICRO NUMBER:: 91185289
SHIGA RESULT:: NOT DETECTED
SPECIMEN QUALITY: ADEQUATE
SPECIMEN QUALITY:: ADEQUATE
SPECIMEN QUALITY:: ADEQUATE

## 2017-11-28 LAB — C. DIFFICILE GDH AND TOXIN A/B
GDH ANTIGEN: NOT DETECTED
MICRO NUMBER:: 91183856
SPECIMEN QUALITY:: ADEQUATE
TOXIN A AND B: NOT DETECTED

## 2017-11-29 ENCOUNTER — Telehealth: Payer: Self-pay | Admitting: Family Medicine

## 2017-11-29 MED ORDER — MESALAMINE 1.2 G PO TBEC: 2.4000 g | DELAYED_RELEASE_TABLET | Freq: Every day | ORAL | 0 refills | Status: DC

## 2017-11-29 NOTE — Telephone Encounter (Signed)
-----   Message from Richard Baldwin, [place] sent at 11/29/2017 12:54 PM EDT ----- Pt notified of Dr. Marliss Coots instructions. Pt said that he wants to try the med 1st please send it in the [facility]. [place], pt said he will give the med a week and if he doesn't see a significant difference then he will call back and request GI referral but for now he wants to hold off on a GI referral and give me med a chance

## 2017-11-29 NOTE — Telephone Encounter (Signed)
done

## 2017-12-06 ENCOUNTER — Telehealth: Payer: Self-pay | Admitting: *Deleted

## 2017-12-06 DIAGNOSIS — R197 Diarrhea, unspecified: Secondary | ICD-10-CM

## 2017-12-06 DIAGNOSIS — Z8719 Personal history of other diseases of the digestive system: Secondary | ICD-10-CM | POA: Insufficient documentation

## 2017-12-06 NOTE — Telephone Encounter (Signed)
Ref done I will send back to [facility]

## 2017-12-06 NOTE — Telephone Encounter (Signed)
Copied from [place] [id number]. Topic: Referral - Request for Referral >> Dec 06, 2017  9:03 AM Judyann Munson wrote: Has patient seen PCP for this complaint? yes  Referral for which specialty: GI- seen in 2014 Preferred provider/office:  Reason for referral: colitis flare up

## 2017-12-06 NOTE — Telephone Encounter (Signed)
GI Appt made with Dr Elizabeth Pal[facility] and patient is aware.

## 2017-12-09 ENCOUNTER — Encounter: Payer: Self-pay | Admitting: Gastroenterology

## 2017-12-09 ENCOUNTER — Ambulatory Visit: Payer: BLUE CROSS/BLUE SHIELD | Admitting: Gastroenterology

## 2017-12-09 ENCOUNTER — Other Ambulatory Visit (INDEPENDENT_AMBULATORY_CARE_PROVIDER_SITE_OTHER): Payer: BLUE CROSS/BLUE SHIELD

## 2017-12-09 VITALS — BP 118/64 | HR 64 | Ht 69.69 in | Wt 246.1 lb

## 2017-12-09 DIAGNOSIS — R195 Other fecal abnormalities: Secondary | ICD-10-CM

## 2017-12-09 DIAGNOSIS — R197 Diarrhea, unspecified: Secondary | ICD-10-CM | POA: Diagnosis not present

## 2017-12-09 DIAGNOSIS — Z8719 Personal history of other diseases of the digestive system: Secondary | ICD-10-CM

## 2017-12-09 LAB — BASIC METABOLIC PANEL
BUN: 32 mg/dL — AB (ref 6–23)
CALCIUM: 9.7 mg/dL (ref 8.4–10.5)
CO2: 28 meq/L (ref 19–32)
Chloride: 100 mEq/L (ref 96–112)
Creatinine, Ser: 1.17 mg/dL (ref 0.40–1.50)
GFR: 67.63 mL/min (ref >60.00)
GLUCOSE: 103 mg/dL — AB (ref 70–99)
Potassium: 4.5 mEq/L (ref 3.5–5.1)
Sodium: 137 mEq/L (ref 135–145)

## 2017-12-09 LAB — CBC
HCT: 40 % (ref 39.0–52.0)
Hemoglobin: 13.4 g/dL (ref 13.0–17.0)
MCHC: 33.5 g/dL (ref 30.0–36.0)
MCV: 91.1 fl (ref 78.0–100.0)
Platelets: 243 10*3/uL (ref 150.0–400.0)
RBC: 4.39 Mil/uL (ref 4.22–5.81)
RDW: 12.8 % (ref 11.5–15.5)
WBC: 8.9 10*3/uL (ref 4.0–10.5)

## 2017-12-09 LAB — IGA: IGA: 167 mg/dL (ref 68–378)

## 2017-12-09 LAB — TSH: TSH: 1.06 u[IU]/mL (ref 0.35–4.50)

## 2017-12-09 NOTE — Patient Instructions (Signed)
Your provider has requested that you go to the basement level for lab work before leaving today. Press "B" on the elevator. The lab is located at the first door on the left as you exit the elevator.  Normal BMI (Body Mass Index- based on height and weight) is between 19 and 25. Your BMI today is Body mass index is 35.64 kg/m. Marland Kitchen Please consider follow up  regarding your BMI with your Primary Care Provider.  Thank you for entrusting me with your care and choosing [facility].  Dr Rush Landmark

## 2017-12-09 NOTE — Progress Notes (Signed)
[place] VISIT   Primary Care Provider Tower, Wynelle Fanny, MD [address] [place] [address] [phone]  Referring Provider Glori Bickers, Wynelle Fanny, MD [address] [place], [place] [address] [phone]  Patient Profile: Richard Baldwin is a 61 y.o. male with a pmh significant for CHFpEF, GERD, HTN, Diverticulosis, Hemorrhoids.  The patient presents to the [facility] for an evaluation and management of problem(s) noted below:  Problem List 1. Acute diarrhea   2. Dark stools   3. History of colitis     History of Present Illness: This is the patient's first visit to the GI [place] clinic in over 5 years.  He was previously followed by Dr. Deatra Ina.  He had prior colonoscopy as well as flexible sigmoidoscopy.  At one point in time in 2014 he had evidence of diarrhea.  He underwent a flex sig that showed concern for colitis endoscopically but all biopsies returned normal without any evidence of inflammation.  He was treated with Lialda for a 2 to 3-week.  With resolution of his symptoms.  He never followed up in GI clinic thereafter.  He did well for the next 5-years.  The patient describes having acute onset of diarrhea about 2 to 2-1/2 weeks ago.  He was seen on October 2 by his primary care doctor because of recurrent/persistent symptoms.  He had some basic stool studies sent and he left for business out of town.  By 10 October he was feeling better with improvement in his bowel movements.  He was prescribed by his primary care doctor mesalamine but as he had not been home to pick up the prescription he did not start that until October 11 (Friday).  He took this for 3 days.  On the 12th things had changed significantly and he had recurrence of his diarrheal symptoms with multiple bowel movements that were loose.  He began to restart taking Pepto-Bismol.  He continued that Pepto-Bismol through the course of Monday.  During the  weekend he felt extremely ill and could not move out of bed because of how weak and fatigued he was.  This is definitely a change from his normal basis when he is up and about and moving significantly.  By Monday things started to slowly improve.  However he noted a change in the color of his stool to become darker in quality it was not clear if he was having GI bleeding or if this was a result of Pepto-Bismol use.  He denies at this point time having any significant fecal urgency.  He has no fecal incontinence.  He has not noted any bright red blood per rectum.  The patient does take nonsteroidals on an infrequent basis.  He has had a prior colonoscopy and flexible sigmoidoscopy as described below he has not had an upper endoscopy.  GI Review of Systems Positive as above Negative for pyrosis, dysphagia, odynophagia, nausea, vomiting, early satiety, bloating  Review of Systems General: Positive for mild weight loss over the weekend; denies fevers/chills HEENT: Denies oral lesions Cardiovascular: Denies chest pain Pulmonary: Denies shortness of breath Gastroenterological: See HPI Genitourinary: Denies darkened urine Hematological: Denies easy bruising/bleeding Endocrine: Denies temperature intolerance Dermatological: Denies new skin changes Psychological: Mood is stable but somewhat concerned as to why he had this occur again years later Musculoskeletal: Denies new arthralgias   Medications Current Outpatient Medications  Medication Sig Dispense Refill  . Ascorbic Acid (VITAMIN C) 1000 MG tablet Take 1,000 mg  by mouth daily.      Marland Kitchen aspirin 81 MG tablet Take 81 mg by mouth daily.      . fish oil-omega-3 fatty acids 1000 MG capsule Take 1 g by mouth daily.      Marland Kitchen lisinopril (PRINIVIL,ZESTRIL) 40 MG tablet Take 1 tablet (40 mg total) by mouth daily. 90 tablet 3  . meloxicam (MOBIC) 15 MG tablet Take 1 tablet (15 mg total) by mouth daily as needed for pain. With food 30 tablet 1  . metoprolol  succinate (TOPROL-XL) 25 MG 24 hr tablet TAKE 1 TABLET (25 MG TOTAL) BY MOUTH DAILY. 90 tablet 1  . metoprolol succinate (TOPROL-XL) 50 MG 24 hr tablet Take 1 tablet (50 mg total) by mouth daily. 90 tablet 3  . nitroGLYCERIN (NITROSTAT) 0.4 MG SL tablet PLACE 1 TABLET UNDER TONGUE EVERY 5 MINS FOR 3 DOSES AS NEEDED 25 tablet 2  . omeprazole (PRILOSEC) 20 MG capsule Take 1 capsule (20 mg total) by mouth daily. 90 capsule 3  . rosuvastatin (CRESTOR) 40 MG tablet Take 1 tablet (40 mg total) by mouth daily. 90 tablet 3   No current facility-administered medications for this visit.     Allergies Allergies  Allergen Reactions  . Atorvastatin     REACTION: elevated liver functions  . Butenafine     REACTION: burned  . Varenicline Tartrate     REACTION: sleepy    Histories Past Medical History:  Diagnosis Date  . Abnormal nuclear cardiac imaging test 04/07/10   EF 48%; inferior scar; mild-moderate peri-infarct ischemia  . CAD (coronary artery disease)    s/p Promus DES x 2 RCA 04/08/10;  Cath 2/12: mLAD 30%; CFX mild plaque; mRCA 99% then ulc. 80% tx'd with PCI, dRCA 30%; oPLB 90% (L-R collat); EF 55%  . Colon polyps   . GERD (gastroesophageal reflux disease)   . Hemorrhoids   . Hyperlipidemia   . Hypertension    Past Surgical History:  Procedure Laterality Date  . COLONOSCOPY W/ POLYPECTOMY  2010  . Left heart catheterization  04/09/10  . PTCA  04/09/10    with placement two overlapping drug-eluting stents in the mid right coronary artery.   . TONSILLECTOMY     Social History   Socioeconomic History  . Marital status: Married    Spouse name: Not on file  . Number of children: Not on file  . Years of education: Not on file  . Highest education level: Not on file  Occupational History  . Not on file  Social Needs  . Financial resource strain: Not on file  . Food insecurity:    Worry: Not on file    Inability: Not on file  . Transportation needs:    Medical: Not on file     Non-medical: Not on file  Tobacco Use  . Smoking status: Former Smoker    Types: Cigarettes    Last attempt to quit: 02/23/2005    Years since quitting: 12.8  . Smokeless tobacco: Never Used  . Tobacco comment: Quit smoking 02/2005.  Substance and Sexual Activity  . Alcohol use: Yes    Alcohol/week: 4.0 standard drinks    Types: 4 Cans of beer per week    Comment: occ  . Drug use: No  . Sexual activity: Not on file  Lifestyle  . Physical activity:    Days per week: Not on file    Minutes per session: Not on file  . Stress: Not on file  Relationships  .  Social connections:    Talks on phone: Not on file    Gets together: Not on file    Attends religious service: Not on file    Active member of club or organization: Not on file    Attends meetings of clubs or organizations: Not on file    Relationship status: Not on file  . Intimate partner violence:    Fear of current or ex partner: Not on file    Emotionally abused: Not on file    Physically abused: Not on file    Forced sexual activity: Not on file  Other Topics Concern  . Not on file  Social History Narrative   Regular exercise at gym.   Family History  Problem Relation Age of Onset  . Heart attack Father   . Diabetes Father   . Heart disease Father        CAD  . Heart disease Other        Strong family hx of CAD  . Sudden death Cousin        ? Cardiac  . Kidney disease Maternal Aunt   . Kidney disease Maternal Uncle   . Kidney disease Paternal Aunt   . Prostate cancer Paternal Uncle   . Liver disease Paternal Uncle   . Kidney disease Paternal Uncle   . Colon cancer Neg Hx   . Esophageal cancer Neg Hx   . Inflammatory bowel disease Neg Hx   . Pancreatic cancer Neg Hx   . Rectal cancer Neg Hx   . Stomach cancer Neg Hx    I have reviewed his medical, social, and family history in detail and updated the electronic medical record as necessary.    PHYSICAL EXAMINATION  BP 118/64 (BP Location: Left Arm,  Patient Position: Sitting, Cuff Size: Normal)   Pulse 64   Ht 5' 9.69" (1.77 m)   Wt 246 lb 2 oz (111.6 kg)   BMI 35.64 kg/m  Wt Readings from Last 3 Encounters:  12/09/17 246 lb 2 oz (111.6 kg)  11/24/17 243 lb 8 oz (110.5 kg)  11/05/17 246 lb (111.6 kg)  GEN: NAD, appears stated age, doesn't appear chronically ill PSYCH: Cooperative, without pressured speech EYE: Conjunctivae pink, sclerae anicteric ENT: MMM, without oral ulcers, no erythema or exudates noted NECK: Supple CV: RR without R/Gs  RESP: CTAB posteriorly, without wheezing GI: NABS, soft, protuberant, rounded, NT/ND, without rebound or guarding, no HSM appreciated MSK/EXT: No lower extremity edema SKIN: No jaundice NEURO:  Alert & Oriented x 3, no focal deficits   REVIEW OF DATA  I reviewed the following data at the time of this encounter:  GI Procedures and Studies  January 2010 colonoscopy Sessile polyp in cecum/IC valve region.  5 mm in size.  Polyp snare resected via cautery.  Diverticulosis in sigmoid and descending colon.  Scattered diverticula in the right colon.  Otherwise normal-appearing colon per report.  On my review of image 12 it does look like the patient also has hemorrhoids though this was not noted in the colonoscopy report.  There is no adenomatous tissue found on this biopsy based on pathology report.  July 2014 sigmoidoscopy Performed for unexplained diarrhea.  Diffuse colitis characterized by erythematous mucosa with areas of submucosal hemorrhage that were noted to be mild to moderate.  These were seen throughout the left colon.  Biopsies were obtained.  No pseudomembranes or erosions or ulcers were noted.  Pathology was consistent with benign colon mucosa without any inflammation  noted.  Laboratory Studies  Reviewed in epic  Imaging Studies  9/17 KUB IMPRESSION: Question of focal ileus versus early small bowel obstruction.   ASSESSMENT  Mr. Mittelstaedt is a 62 y.o. male with a pmh significant  for CHFpEF, GERD, HTN, Diverticulosis, Hemorrhoids.  The patient is seen today for evaluation and management of:  1. Acute diarrhea   2. Dark stools   3. History of colitis    Patient is hemodynamically and clinically stable at this point in time with improving symptoms.  Etiology of his acute diarrheal symptoms/disease process are not completely clear to me at this point in time.  However brief.  Of time that he is experienced this over the course of the last few weeks and his clinical improvement with use of supportive agents suggest more likely an inflammatory process as a result of either a infectious process.  We will complete stool studies to be GI pathogen panel.  We will rule out other etiologies for recurrent/chronic diarrhea as noted below.  Dependent on his symptoms one could query the possibility of underlying segmental colitis associated with diverticulosis that he is not had recurrent or true diverticulitis symptoms.  He continues to have recurrent diarrhea will consider the role of endoscopic evaluation and/or the possibility of needing imaging which would include a CT abdomen pelvis with contrast.  I think his darker stools are likely a result of his Pepto use however we will obtain a blood count and a BMP to evaluate his blood counts as well as his urea level.  All patient questions were answered, to the best of my ability, and the patient agrees to the aforementioned plan of action with follow-up as indicated.   PLAN  1. Acute diarrhea - Basic metabolic panel; Future - TSH; Future - Tissue transglutaminase, IgA; Future - IgA; Future - Gastrointestinal Pathogen Panel PCR; Future - hsCRP; Future - Consider role of CTAP with contrast if recurrent diarrhea to look for particular regions of inflammation - Consider repeat endoscopic evaluation to rule out SCAD - Try to minimize NSAID use as able  2. Dark stools - CBC; Future - Stop Pepto Bismol use  3. History of colitis - Not  clearly delineated as to etiology years ago but no clear evidence of IBD on prior biopsies (may consider role of full colonoscopy with TI/Colon biopsies in future dependent on recurrent symptomatology)   Orders Placed This Encounter  Procedures  . CBC  . Basic metabolic panel  . TSH  . Tissue transglutaminase, IgA  . IgA  . Gastrointestinal Pathogen Panel PCR    New Prescriptions   No medications on file   Modified Medications   No medications on file    Planned Follow Up: No follow-ups on file.   Justice Britain, MD [facility] Office # [phone]

## 2017-12-10 ENCOUNTER — Other Ambulatory Visit: Payer: Self-pay | Admitting: Gastroenterology

## 2017-12-10 ENCOUNTER — Other Ambulatory Visit (INDEPENDENT_AMBULATORY_CARE_PROVIDER_SITE_OTHER): Payer: BLUE CROSS/BLUE SHIELD

## 2017-12-10 DIAGNOSIS — R197 Diarrhea, unspecified: Secondary | ICD-10-CM

## 2017-12-10 DIAGNOSIS — R195 Other fecal abnormalities: Secondary | ICD-10-CM

## 2017-12-10 LAB — HIGH SENSITIVITY CRP: CRP, High Sensitivity: 7.38 mg/L — ABNORMAL HIGH (ref 0.000–5.000)

## 2017-12-10 LAB — TISSUE TRANSGLUTAMINASE, IGA: (TTG) AB, IGA: 1 U/mL

## 2017-12-12 ENCOUNTER — Encounter: Payer: Self-pay | Admitting: Gastroenterology

## 2017-12-13 DIAGNOSIS — R197 Diarrhea, unspecified: Secondary | ICD-10-CM | POA: Insufficient documentation

## 2017-12-13 DIAGNOSIS — R195 Other fecal abnormalities: Secondary | ICD-10-CM | POA: Insufficient documentation

## 2017-12-21 ENCOUNTER — Telehealth: Payer: Self-pay | Admitting: Gastroenterology

## 2017-12-21 NOTE — Telephone Encounter (Signed)
-----   Message from Angie Fava, LPN sent at [phone]  4:51 PM EDT ----- Regarding: lab order Call patient to remind him to come in for labs hs crp for 12/27/17

## 2017-12-21 NOTE — Telephone Encounter (Signed)
Patient has been notified to come to office on 12/27/17 to have labs drawn.

## 2017-12-24 ENCOUNTER — Other Ambulatory Visit (INDEPENDENT_AMBULATORY_CARE_PROVIDER_SITE_OTHER): Payer: BLUE CROSS/BLUE SHIELD

## 2017-12-24 DIAGNOSIS — R195 Other fecal abnormalities: Secondary | ICD-10-CM

## 2017-12-24 DIAGNOSIS — R197 Diarrhea, unspecified: Secondary | ICD-10-CM

## 2017-12-24 LAB — HIGH SENSITIVITY CRP: CRP, High Sensitivity: 2.18 mg/L (ref 0.000–5.000)

## 2018-01-31 ENCOUNTER — Telehealth: Payer: Self-pay | Admitting: Family Medicine

## 2018-01-31 DIAGNOSIS — Z0000 Encounter for general adult medical examination without abnormal findings: Secondary | ICD-10-CM

## 2018-01-31 DIAGNOSIS — E7800 Pure hypercholesterolemia, unspecified: Secondary | ICD-10-CM

## 2018-01-31 DIAGNOSIS — I10 Essential (primary) hypertension: Secondary | ICD-10-CM

## 2018-01-31 DIAGNOSIS — Z125 Encounter for screening for malignant neoplasm of prostate: Secondary | ICD-10-CM

## 2018-01-31 NOTE — Telephone Encounter (Signed)
-----   Message from Lendon Collar, RT sent at 01/25/2018 10:34 AM EST ----- Regarding: Lab orders for Tuesday Dec 10th Please enter CPE lab orders for 02/01/18. Thanks!

## 2018-02-01 ENCOUNTER — Other Ambulatory Visit (INDEPENDENT_AMBULATORY_CARE_PROVIDER_SITE_OTHER): Payer: BLUE CROSS/BLUE SHIELD

## 2018-02-01 DIAGNOSIS — Z125 Encounter for screening for malignant neoplasm of prostate: Secondary | ICD-10-CM | POA: Diagnosis not present

## 2018-02-01 DIAGNOSIS — I10 Essential (primary) hypertension: Secondary | ICD-10-CM

## 2018-02-01 DIAGNOSIS — E7800 Pure hypercholesterolemia, unspecified: Secondary | ICD-10-CM | POA: Diagnosis not present

## 2018-02-01 LAB — TSH: TSH: 1.04 u[IU]/mL (ref 0.35–4.50)

## 2018-02-01 LAB — COMPREHENSIVE METABOLIC PANEL
ALT: 26 U/L (ref 0–53)
AST: 22 U/L (ref 0–37)
Albumin: 4.6 g/dL (ref 3.5–5.2)
Alkaline Phosphatase: 44 U/L (ref 39–117)
BUN: 22 mg/dL (ref 6–23)
CHLORIDE: 102 meq/L (ref 96–112)
CO2: 28 meq/L (ref 19–32)
Calcium: 10 mg/dL (ref 8.4–10.5)
Creatinine, Ser: 0.95 mg/dL (ref 0.40–1.50)
GFR: 85.97 mL/min (ref >60.00)
Glucose, Bld: 104 mg/dL — ABNORMAL HIGH (ref 70–99)
Potassium: 4.5 mEq/L (ref 3.5–5.1)
Sodium: 138 mEq/L (ref 135–145)
Total Bilirubin: 0.6 mg/dL (ref 0.2–1.2)
Total Protein: 6.9 g/dL (ref 6.0–8.3)

## 2018-02-01 LAB — LIPID PANEL
Cholesterol: 165 mg/dL (ref 0–200)
HDL: 47.6 mg/dL (ref >39.00)
NonHDL: 117.3
Total CHOL/HDL Ratio: 3
Triglycerides: 224 mg/dL — ABNORMAL HIGH (ref 0.0–149.0)
VLDL: 44.8 mg/dL — ABNORMAL HIGH (ref 0.0–40.0)

## 2018-02-01 LAB — CBC WITH DIFFERENTIAL/PLATELET
Basophils Absolute: 0 10*3/uL (ref 0.0–0.1)
Basophils Relative: 0.7 % (ref 0.0–3.0)
EOS PCT: 3.7 % (ref 0.0–5.0)
Eosinophils Absolute: 0.2 10*3/uL (ref 0.0–0.7)
HCT: 43.2 % (ref 39.0–52.0)
Hemoglobin: 14.4 g/dL (ref 13.0–17.0)
Lymphocytes Relative: 27 % (ref 12.0–46.0)
Lymphs Abs: 1.6 10*3/uL (ref 0.7–4.0)
MCHC: 33.3 g/dL (ref 30.0–36.0)
MCV: 92.3 fl (ref 78.0–100.0)
Monocytes Absolute: 0.6 10*3/uL (ref 0.1–1.0)
Monocytes Relative: 9.3 % (ref 3.0–12.0)
Neutro Abs: 3.6 10*3/uL (ref 1.4–7.7)
Neutrophils Relative %: 59.3 % (ref 43.0–77.0)
Platelets: 229 10*3/uL (ref 150.0–400.0)
RBC: 4.68 Mil/uL (ref 4.22–5.81)
RDW: 13.3 % (ref 11.5–15.5)
WBC: 6.1 10*3/uL (ref 4.0–10.5)

## 2018-02-01 LAB — LDL CHOLESTEROL, DIRECT: Direct LDL: 90 mg/dL

## 2018-02-01 LAB — PSA: PSA: 2.61 ng/mL (ref 0.10–4.00)

## 2018-02-08 ENCOUNTER — Telehealth: Payer: Self-pay | Admitting: Family Medicine

## 2018-02-08 ENCOUNTER — Encounter: Payer: BLUE CROSS/BLUE SHIELD | Admitting: Family Medicine

## 2018-02-08 NOTE — Telephone Encounter (Signed)
Left message asking pt to call office  Dr tower will be out of office please r/s 12/17 appointment

## 2018-02-17 ENCOUNTER — Encounter: Payer: Self-pay | Admitting: Family Medicine

## 2018-02-17 ENCOUNTER — Ambulatory Visit (INDEPENDENT_AMBULATORY_CARE_PROVIDER_SITE_OTHER): Payer: BLUE CROSS/BLUE SHIELD | Admitting: Family Medicine

## 2018-02-17 VITALS — BP 138/82 | HR 87 | Temp 97.9°F | Ht 70.0 in | Wt 242.5 lb

## 2018-02-17 DIAGNOSIS — Z23 Encounter for immunization: Secondary | ICD-10-CM

## 2018-02-17 DIAGNOSIS — Z0000 Encounter for general adult medical examination without abnormal findings: Secondary | ICD-10-CM

## 2018-02-17 DIAGNOSIS — I10 Essential (primary) hypertension: Secondary | ICD-10-CM

## 2018-02-17 DIAGNOSIS — R972 Elevated prostate specific antigen [PSA]: Secondary | ICD-10-CM

## 2018-02-17 DIAGNOSIS — Z1211 Encounter for screening for malignant neoplasm of colon: Secondary | ICD-10-CM

## 2018-02-17 DIAGNOSIS — E7800 Pure hypercholesterolemia, unspecified: Secondary | ICD-10-CM | POA: Diagnosis not present

## 2018-02-17 DIAGNOSIS — R7309 Other abnormal glucose: Secondary | ICD-10-CM | POA: Insufficient documentation

## 2018-02-17 DIAGNOSIS — Z125 Encounter for screening for malignant neoplasm of prostate: Secondary | ICD-10-CM

## 2018-02-17 MED ORDER — ROSUVASTATIN CALCIUM 40 MG PO TABS: 40.0000 mg | ORAL_TABLET | Freq: Every day | ORAL | 3 refills | Status: DC

## 2018-02-17 MED ORDER — NITROGLYCERIN 0.4 MG SL SUBL: SUBLINGUAL_TABLET | SUBLINGUAL | 2 refills | Status: AC

## 2018-02-17 NOTE — Patient Instructions (Addendum)
Try to get most of your carbohydrates from produce (with the exception of white potatoes)  Eat less bread/pasta/rice/snack foods/cereals/sweets and other items from the middle of the grocery store (processed carbs)   Take care of yourself  Keep trying to exercise when you can

## 2018-02-17 NOTE — Assessment & Plan Note (Signed)
104 fasting  Trig up as well ? From crestor  disc imp of low glycemic diet and wt loss to prevent DM2  Will follow

## 2018-02-17 NOTE — Assessment & Plan Note (Signed)
Lab Results  Component Value Date   PSA 2.61 02/01/2018   PSA 1.82 01/29/2017   PSA 2.60 01/23/2016   no urinary symptoms  Nocturia 0-1 time  Uncle with prostate cancer He has seen urology in the past- rec yearly psa

## 2018-02-17 NOTE — Assessment & Plan Note (Signed)
Reviewed health habits including diet and exercise and skin cancer prevention Reviewed appropriate screening tests for age  Also reviewed health mt list, fam hx and immunization status , as well as social and family history   See HPI Labs rev  Enc healthy habits Tdap today  Colonoscopy next month

## 2018-02-17 NOTE — Assessment & Plan Note (Signed)
bp in fair control at this time  BP Readings from Last 1 Encounters:  02/17/18 138/82   No changes needed Most recent labs reviewed  Disc lifstyle change with low sodium diet and exercise

## 2018-02-17 NOTE — Assessment & Plan Note (Signed)
Disc goals for lipids and reasons to control them Rev last labs with pt Rev low sat fat diet in detail Continue generic crestor

## 2018-02-17 NOTE — Assessment & Plan Note (Signed)
Urology rec yearly psa Lab Results  Component Value Date   PSA 2.61 02/01/2018   PSA 1.82 01/29/2017   PSA 2.60 01/23/2016    No symptoms

## 2018-02-17 NOTE — Progress Notes (Signed)
Subjective:    Patient ID: Richard Baldwin, male    DOB: 1955-09-12, 62 y.o.   MRN: [id number]  HPI Here for health maintenance exam and to review chronic medical problems    Has been feeling good overall   Wt Readings from Last 3 Encounters:  02/17/18 242 lb 8 oz (110 kg)  12/09/17 246 lb 2 oz (111.6 kg)  11/24/17 243 lb 8 oz (110.5 kg)   34.80 kg/m   Trying to have better self care Work hours go up and up  Traveling a lot  May be able to retire in a few years  Cannot work a different position w/in the co  Does the best he can   Tetanus shot 11/09 Will get there today    Has colonoscopy planned soon - january Flex sig 2014  H/o colitis  Diarrhea is better -   Prostate health Lab Results  Component Value Date   PSA 2.61 02/01/2018   PSA 1.82 01/29/2017   PSA 2.60 01/23/2016   has had elevated psa in the past and saw urology  P uncle had prostate cancer  No change in urinary habits  Nocturia 0-1  No change in flow   Flu shot 10/19  Had shingrix series this year   bp is up on first check Known CAD Lisinopril, metoprolol  No cp or palpitations or headaches or edema  No side effects to medicines  BP Readings from Last 3 Encounters:  02/17/18 (!) 142/80  12/09/17 118/64  11/24/17 (!) 136/94   better after sitting BP: 138/82     Hyperlipidemia Lab Results  Component Value Date   CHOL 165 02/01/2018   CHOL 136 01/29/2017   CHOL 166 01/23/2016   Lab Results  Component Value Date   HDL 47.60 02/01/2018   HDL 40.40 01/29/2017   HDL 46.40 01/23/2016   Lab Results  Component Value Date   LDLCALC 59 01/29/2017   LDLCALC 92 01/23/2016   LDLCALC 82 01/15/2015   Lab Results  Component Value Date   TRIG 224.0 (H) 02/01/2018   TRIG 181.0 (H) 01/29/2017   TRIG 140.0 01/23/2016   Lab Results  Component Value Date   CHOLHDL 3 02/01/2018   CHOLHDL 3 01/29/2017   CHOLHDL 4 01/23/2016   Lab Results  Component Value Date   LDLDIRECT 90.0  02/01/2018   LDLDIRECT 99.3 02/12/2012   LDLDIRECT 124.5 08/19/2009   Well controlled with crestor and diet   Sees derm 2 times per year Small moles removed and frozen  Lab Results  Component Value Date   CREATININE 0.95 02/01/2018   BUN 22 02/01/2018   NA 138 02/01/2018   K 4.5 02/01/2018   CL 102 02/01/2018   CO2 28 02/01/2018   Lab Results  Component Value Date   ALT 26 02/01/2018   AST 22 02/01/2018   ALKPHOS 44 02/01/2018   BILITOT 0.6 02/01/2018   Lab Results  Component Value Date   TSH 1.04 02/01/2018    Lab Results  Component Value Date   WBC 6.1 02/01/2018   HGB 14.4 02/01/2018   HCT 43.2 02/01/2018   MCV 92.3 02/01/2018   PLT 229.0 02/01/2018    Review of Systems  Constitutional: Positive for fatigue. Negative for activity change, appetite change, fever and unexpected weight change.       Fatigue from schedule   HENT: Negative for congestion, rhinorrhea, sore throat and trouble swallowing.   Eyes: Negative for pain, redness, itching and  visual disturbance.  Respiratory: Negative for cough, chest tightness, shortness of breath and wheezing.   Cardiovascular: Negative for chest pain and palpitations.  Gastrointestinal: Negative for abdominal pain, blood in stool, constipation, diarrhea and nausea.  Endocrine: Negative for cold intolerance, heat intolerance, polydipsia and polyuria.  Genitourinary: Negative for difficulty urinating, dysuria, frequency and urgency.  Musculoskeletal: Negative for arthralgias, joint swelling and myalgias.  Skin: Negative for pallor and rash.  Neurological: Negative for dizziness, tremors, weakness, numbness and headaches.  Hematological: Negative for adenopathy. Does not bruise/bleed easily.  Psychiatric/Behavioral: Negative for decreased concentration and dysphoric mood. The patient is not nervous/anxious.        Objective:   Physical Exam Constitutional:      General: He is not in acute distress.    Appearance: He is  well-developed. He is obese. He is not ill-appearing.  HENT:     Head: Normocephalic and atraumatic.     Right Ear: External ear normal.     Left Ear: External ear normal.     Nose: Nose normal.  Eyes:     General: No scleral icterus.       Right eye: No discharge.        Left eye: No discharge.     Conjunctiva/sclera: Conjunctivae normal.     Pupils: Pupils are equal, round, and reactive to light.  Neck:     Musculoskeletal: Normal range of motion and neck supple.     Thyroid: No thyromegaly.     Vascular: No carotid bruit or JVD.  Cardiovascular:     Rate and Rhythm: Normal rate and regular rhythm.     Heart sounds: Normal heart sounds. No gallop.   Pulmonary:     Effort: Pulmonary effort is normal. No respiratory distress.     Breath sounds: Normal breath sounds. No wheezing.  Chest:     Chest wall: No tenderness.  Abdominal:     General: Bowel sounds are normal. There is no distension or abdominal bruit.     Palpations: Abdomen is soft. There is no mass.     Tenderness: There is no abdominal tenderness.  Musculoskeletal:        General: No tenderness.     Right lower leg: No edema.     Left lower leg: No edema.  Lymphadenopathy:     Cervical: No cervical adenopathy.  Skin:    General: Skin is warm and dry.     Capillary Refill: Capillary refill takes less than 2 seconds.     Coloration: Skin is not pale.     Findings: No erythema or rash.     Comments: Ruddy complexion  Some SKs and solar aging with lentigines   Neurological:     General: No focal deficit present.     Mental Status: He is alert.     Cranial Nerves: No cranial nerve deficit.     Motor: No abnormal muscle tone.     Coordination: Coordination normal.     Deep Tendon Reflexes: Reflexes are normal and symmetric. Reflexes normal.  Psychiatric:        Mood and Affect: Mood normal.           Assessment & Plan:   Problem List Items Addressed This Visit      Cardiovascular and Mediastinum    Essential hypertension    bp in fair control at this time  BP Readings from Last 1 Encounters:  02/17/18 138/82   No changes needed Most recent labs reviewed  Disc lifstyle change with low sodium diet and exercise        Relevant Medications   nitroGLYCERIN (NITROSTAT) 0.4 MG SL tablet   rosuvastatin (CRESTOR) 40 MG tablet     Other   Routine general medical examination at a health care facility - Primary    Reviewed health habits including diet and exercise and skin cancer prevention Reviewed appropriate screening tests for age  Also reviewed health mt list, fam hx and immunization status , as well as social and family history   See HPI Labs rev  Enc healthy habits Tdap today  Colonoscopy next month         PSA elevation    Urology rec yearly psa Lab Results  Component Value Date   PSA 2.61 02/01/2018   PSA 1.82 01/29/2017   PSA 2.60 01/23/2016    No symptoms       Prostate cancer screening    Lab Results  Component Value Date   PSA 2.61 02/01/2018   PSA 1.82 01/29/2017   PSA 2.60 01/23/2016   no urinary symptoms  Nocturia 0-1 time  Uncle with prostate cancer He has seen urology in the past- rec yearly psa      HYPERCHOLESTEROLEMIA    Disc goals for lipids and reasons to control them Rev last labs with pt Rev low sat fat diet in detail Continue generic crestor       Relevant Medications   nitroGLYCERIN (NITROSTAT) 0.4 MG SL tablet   rosuvastatin (CRESTOR) 40 MG tablet   Elevated glucose    104 fasting  Trig up as well ? From crestor  disc imp of low glycemic diet and wt loss to prevent DM2  Will follow       Colon cancer screening    In the setting of h/o colitis Colonoscopy planned jan 2020       Other Visit Diagnoses    Need for Tdap vaccination       Relevant Orders   Tdap vaccine greater than or equal to 7yo IM (Completed)

## 2018-02-17 NOTE — Assessment & Plan Note (Signed)
In the setting of h/o colitis Colonoscopy planned jan 2020

## 2018-02-26 ENCOUNTER — Encounter: Payer: Self-pay | Admitting: Family Medicine

## 2018-03-02 NOTE — Telephone Encounter (Signed)
See letter in IN box Unsure if he wants mail or pick up

## 2018-03-25 ENCOUNTER — Encounter: Payer: Self-pay | Admitting: Gastroenterology

## 2018-04-12 ENCOUNTER — Other Ambulatory Visit: Payer: Self-pay

## 2018-04-12 ENCOUNTER — Ambulatory Visit (AMBULATORY_SURGERY_CENTER): Payer: Self-pay | Admitting: *Deleted

## 2018-04-12 VITALS — Ht 70.0 in | Wt 246.6 lb

## 2018-04-12 DIAGNOSIS — Z86010 Personal history of colonic polyps: Secondary | ICD-10-CM

## 2018-04-12 MED ORDER — PEG 3350-KCL-NABCB-NACL-NASULF 236 G PO SOLR: 4000.0000 mL | Freq: Once | ORAL | 0 refills | Status: AC

## 2018-04-12 NOTE — Progress Notes (Signed)
Patient denies any allergies to egg or soy products. Patient denies complications with anesthesia/sedation.  Patient denies oxygen use at home and denies diet medications. Emmi instructions for colonoscopy explained but patient does not want any information on a colonoscopy.

## 2018-04-15 ENCOUNTER — Encounter: Payer: Self-pay | Admitting: Gastroenterology

## 2018-04-15 ENCOUNTER — Other Ambulatory Visit: Payer: Self-pay | Admitting: Cardiology

## 2018-04-15 NOTE — Telephone Encounter (Signed)
Rx has been sent to the pharmacy electronically. ° °

## 2018-04-21 NOTE — Progress Notes (Signed)
HPI: FU CAD; admitted from the office on 04/07/10 with unstable angina. He had Myoview study that day that was abnormal with inferior scar and mild to moderate peri-Infarct ischemia. EF was 48%. Cardiac catheterization demonstrated 30% LAD stenosis and mild plaque in the circumflex. He had a mid 99% and then an ulcerated 80% stenosis in the RCA. This was treated with 2 drug-eluting stents (Promus). He also had a 90% ostial posterior lateral branch stenosis. There are left to right collaterals. EF 55%.Nuclear study November 2016 showed ejection fraction 57% with small prior infarct but no ischemia.Since I last saw him,the patient has dyspnea with more extreme activities but not with routine activities. It is relieved with rest. It is not associated with chest pain. There is no orthopnea, PND or pedal edema. There is no syncope or palpitations. There is no exertional chest pain.   Current Outpatient Medications  Medication Sig Dispense Refill  . Ascorbic Acid (VITAMIN C) 1000 MG tablet Take 1,000 mg by mouth daily.      Marland Kitchen aspirin 81 MG tablet Take 81 mg by mouth daily.      . fish oil-omega-3 fatty acids 1000 MG capsule Take 1 g by mouth daily.      . fluticasone (FLONASE) 50 MCG/ACT nasal spray     . lisinopril (PRINIVIL,ZESTRIL) 40 MG tablet TAKE 1 TABLET BY MOUTH EVERY DAY 90 tablet 0  . meloxicam (MOBIC) 15 MG tablet Take 1 tablet (15 mg total) by mouth daily as needed for pain. With food 30 tablet 1  . metoprolol succinate (TOPROL-XL) 50 MG 24 hr tablet Take 1 tablet (50 mg total) by mouth daily. 90 tablet 3  . nitroGLYCERIN (NITROSTAT) 0.4 MG SL tablet PLACE 1 TABLET UNDER TONGUE EVERY 5 MINS FOR 3 DOSES AS NEEDED 25 tablet 2  . omeprazole (PRILOSEC) 20 MG capsule Take 1 capsule (20 mg total) by mouth daily. 90 capsule 3  . rosuvastatin (CRESTOR) 40 MG tablet Take 1 tablet (40 mg total) by mouth daily. 90 tablet 3   No current facility-administered medications for this visit.       Past Medical History:  Diagnosis Date  . Abnormal nuclear cardiac imaging test 04/07/10   EF 48%; inferior scar; mild-moderate peri-infarct ischemia  . CAD (coronary artery disease)    s/p Promus DES x 2 RCA 04/08/10;  Cath 2/12: mLAD 30%; CFX mild plaque; mRCA 99% then ulc. 80% tx'd with PCI, dRCA 30%; oPLB 90% (L-R collat); EF 55%  . Colon polyps   . GERD (gastroesophageal reflux disease)   . Hemorrhoids   . Hyperlipidemia   . Hypertension     Past Surgical History:  Procedure Laterality Date  . COLONOSCOPY W/ POLYPECTOMY  2010  . FLEXIBLE SIGMOIDOSCOPY  2014   normal  . Left heart catheterization  04/09/10  . PTCA  04/09/10    with placement two overlapping drug-eluting stents in the mid right coronary artery.   . TONSILLECTOMY      Social History   Socioeconomic History  . Marital status: Married    Spouse name: Not on file  . Number of children: Not on file  . Years of education: Not on file  . Highest education level: Not on file  Occupational History  . Not on file  Social Needs  . Financial resource strain: Not on file  . Food insecurity:    Worry: Not on file    Inability: Not on file  . Transportation needs:  Medical: Not on file    Non-medical: Not on file  Tobacco Use  . Smoking status: Former Smoker    Packs/day: 0.50    Types: Cigarettes    Last attempt to quit: 02/23/2005    Years since quitting: 13.1  . Smokeless tobacco: Never Used  . Tobacco comment: Started smoking at age 63 yrs, Quit smoking 02/2005.  Substance and Sexual Activity  . Alcohol use: Yes    Alcohol/week: 4.0 standard drinks    Types: 4 Cans of beer per week  . Drug use: No  . Sexual activity: Yes  Lifestyle  . Physical activity:    Days per week: Not on file    Minutes per session: Not on file  . Stress: Not on file  Relationships  . Social connections:    Talks on phone: Not on file    Gets together: Not on file    Attends religious service: Not on file    Active  member of club or organization: Not on file    Attends meetings of clubs or organizations: Not on file    Relationship status: Not on file  . Intimate partner violence:    Fear of current or ex partner: Not on file    Emotionally abused: Not on file    Physically abused: Not on file    Forced sexual activity: Not on file  Other Topics Concern  . Not on file  Social History Narrative   Regular exercise at gym.    Family History  Problem Relation Age of Onset  . Heart attack Father   . Diabetes Father   . Heart disease Father        CAD  . Heart disease Other        Strong family hx of CAD  . Sudden death Cousin        ? Cardiac  . Kidney disease Maternal Aunt   . Kidney disease Maternal Uncle   . Kidney disease Paternal Aunt   . Prostate cancer Paternal Uncle   . Liver disease Paternal Uncle   . Kidney disease Paternal Uncle   . Colon cancer Neg Hx   . Esophageal cancer Neg Hx   . Inflammatory bowel disease Neg Hx   . Pancreatic cancer Neg Hx   . Rectal cancer Neg Hx   . Stomach cancer Neg Hx     ROS: no fevers or chills, productive cough, hemoptysis, dysphasia, odynophagia, melena, hematochezia, dysuria, hematuria, rash, seizure activity, orthopnea, PND, pedal edema, claudication. Remaining systems are negative.  Physical Exam: Well-developed well-nourished in no acute distress.  Skin is warm and dry.  HEENT is normal.  Neck is supple.  Chest is clear to auscultation with normal expansion.  Cardiovascular exam is regular rate and rhythm.  Abdominal exam nontender or distended. No masses palpated. Extremities show no edema. neuro grossly intact  ECG-normal sinus rhythm at a rate of 70, no ST changes.  Personally reviewed  A/P  1 coronary artery disease-patient continues to do well symptomatically with no chest pain.  Mild dyspnea with vigorous activities.  I will arrange an exercise treadmill for risk stratification.  Plan to continue medical therapy with aspirin  and statin.  2 hypertension-patient's blood pressure is elevated; add HCTZ 12.5 mg daily.  Check potassium and renal function in 1 week.  3 hyperlipidemia-continue statin.  Laboratories December 2019 showed LDL 90.  Liver functions normal.  Add Zetia 10 mg daily.  Check lipids and liver in 3  months.  Kirk Ruths, MD

## 2018-04-26 ENCOUNTER — Encounter: Payer: Self-pay | Admitting: Cardiology

## 2018-04-26 ENCOUNTER — Ambulatory Visit: Payer: BLUE CROSS/BLUE SHIELD | Admitting: Cardiology

## 2018-04-26 VITALS — BP 148/80 | HR 70 | Ht 72.0 in | Wt 241.6 lb

## 2018-04-26 DIAGNOSIS — I10 Essential (primary) hypertension: Secondary | ICD-10-CM | POA: Diagnosis not present

## 2018-04-26 DIAGNOSIS — E7800 Pure hypercholesterolemia, unspecified: Secondary | ICD-10-CM

## 2018-04-26 DIAGNOSIS — I2510 Atherosclerotic heart disease of native coronary artery without angina pectoris: Secondary | ICD-10-CM | POA: Diagnosis not present

## 2018-04-26 MED ORDER — HYDROCHLOROTHIAZIDE 12.5 MG PO CAPS: 12.5000 mg | ORAL_CAPSULE | Freq: Every day | ORAL | 3 refills | Status: AC

## 2018-04-26 MED ORDER — METOPROLOL SUCCINATE ER 50 MG PO TB24: 50.0000 mg | ORAL_TABLET | Freq: Every day | ORAL | 3 refills | Status: DC

## 2018-04-26 MED ORDER — EZETIMIBE 10 MG PO TABS: 10.0000 mg | ORAL_TABLET | Freq: Every day | ORAL | 3 refills | Status: AC

## 2018-04-26 MED ORDER — LISINOPRIL 40 MG PO TABS: 40.0000 mg | ORAL_TABLET | Freq: Every day | ORAL | 3 refills | Status: DC

## 2018-04-26 MED ORDER — OMEPRAZOLE 20 MG PO CPDR: 20.0000 mg | DELAYED_RELEASE_CAPSULE | Freq: Every day | ORAL | 3 refills | Status: AC

## 2018-04-26 NOTE — Patient Instructions (Signed)
Medication Instructions:  START HCTZ 12.5 MG ONCE DAILY  START EZETIMIBE 10 MG ONCE DAILY If you need a refill on your cardiac medications before your next appointment, please call your pharmacy.   Lab work: Your physician recommends that you return for lab work in: [place] recommends that you return for lab work in: [place] If you have labs (blood work) drawn today and your tests are completely normal, you will receive your results only by: Marland Kitchen MyChart Message (if you have MyChart) OR . A paper copy in the mail If you have any lab test that is abnormal or we need to change your treatment, we will call you to review the results.  Testing/Procedures: Your physician has requested that you have an exercise tolerance test. For further information please visit [email or web address]. Please also follow instruction sheet, as given. TAKE ALL MEDICATIONS    Follow-Up: At [facility], you and your health needs are our priority.  As part of our continuing mission to provide you with exceptional heart care, we have created designated Provider Care Teams.  These Care Teams include your primary Cardiologist (physician) and Advanced Practice Providers (APPs -  Physician Assistants and Nurse Practitioners) who all work together to provide you with the care you need, when you need it. You will need a follow up appointment in 12 months.  Please call our office 2 months in advance to schedule this appointment.  You may see Kirk Ruths MD or one of the following Advanced Practice Providers on your designated Care Team:   Kerin Ransom, PA-C Roby Lofts, [place] . Sande Rives, PA-C

## 2018-04-28 ENCOUNTER — Encounter: Payer: Self-pay | Admitting: Gastroenterology

## 2018-04-28 ENCOUNTER — Ambulatory Visit (AMBULATORY_SURGERY_CENTER): Payer: BLUE CROSS/BLUE SHIELD | Admitting: Gastroenterology

## 2018-04-28 ENCOUNTER — Other Ambulatory Visit: Payer: Self-pay

## 2018-04-28 VITALS — BP 110/77 | HR 56 | Temp 97.5°F | Resp 12 | Ht 72.0 in | Wt 241.0 lb

## 2018-04-28 DIAGNOSIS — D128 Benign neoplasm of rectum: Secondary | ICD-10-CM

## 2018-04-28 DIAGNOSIS — K5730 Diverticulosis of large intestine without perforation or abscess without bleeding: Secondary | ICD-10-CM

## 2018-04-28 DIAGNOSIS — D122 Benign neoplasm of ascending colon: Secondary | ICD-10-CM

## 2018-04-28 DIAGNOSIS — D123 Benign neoplasm of transverse colon: Secondary | ICD-10-CM | POA: Diagnosis not present

## 2018-04-28 DIAGNOSIS — Z860100 Personal history of colon polyps, unspecified: Secondary | ICD-10-CM

## 2018-04-28 DIAGNOSIS — K621 Rectal polyp: Secondary | ICD-10-CM

## 2018-04-28 MED ORDER — SODIUM CHLORIDE 0.9 % IV SOLN: 500.0000 mL | Freq: Once | INTRAVENOUS | Status: DC

## 2018-04-28 NOTE — Patient Instructions (Signed)
Please read handouts provided. High Fiber Diet. Await pathology results. Continue present medications.       YOU HAD AN ENDOSCOPIC PROCEDURE TODAY AT [facility]:   Refer to the procedure report that was given to you for any specific questions about what was found during the examination.  If the procedure report does not answer your questions, please call your gastroenterologist to clarify.  If you requested that your care partner not be given the details of your procedure findings, then the procedure report has been included in a sealed envelope for you to review at your convenience later.  YOU SHOULD EXPECT: Some feelings of bloating in the abdomen. Passage of more gas than usual.  Walking can help get rid of the air that was put into your GI tract during the procedure and reduce the bloating. If you had a lower endoscopy (such as a colonoscopy or flexible sigmoidoscopy) you may notice spotting of blood in your stool or on the toilet paper. If you underwent a bowel prep for your procedure, you may not have a normal bowel movement for a few days.  Please Note:  You might notice some irritation and congestion in your nose or some drainage.  This is from the oxygen used during your procedure.  There is no need for concern and it should clear up in a day or so.  SYMPTOMS TO REPORT IMMEDIATELY:   Following lower endoscopy (colonoscopy or flexible sigmoidoscopy):  Excessive amounts of blood in the stool  Significant tenderness or worsening of abdominal pains  Swelling of the abdomen that is new, acute  Fever of 100F or higher    For urgent or emergent issues, a gastroenterologist can be reached at any hour by calling [phone].   DIET:  We do recommend a small meal at first, but then you may proceed to your regular diet.  Drink plenty of fluids but you should avoid alcoholic beverages for 24 hours.  ACTIVITY:  You should plan to take it easy for the rest of today  and you should NOT DRIVE or use heavy machinery until tomorrow (because of the sedation medicines used during the test).    FOLLOW UP: Our staff will call the number listed on your records the next business day following your procedure to check on you and address any questions or concerns that you may have regarding the information given to you following your procedure. If we do not reach you, we will leave a message.  However, if you are feeling well and you are not experiencing any problems, there is no need to return our call.  We will assume that you have returned to your regular daily activities without incident.  If any biopsies were taken you will be contacted by phone or by letter within the next 1-3 weeks.  Please call us at [phone] if you have not heard about the biopsies in 3 weeks.    SIGNATURES/CONFIDENTIALITY: You and/or your care partner have signed paperwork which will be entered into your electronic medical record.  These signatures attest to the fact that that the information above on your After Visit Summary has been reviewed and is understood.  Full responsibility of the confidentiality of this discharge information lies with you and/or your care-partner.

## 2018-04-28 NOTE — Progress Notes (Signed)
Report to PACU, RN, vss, BBS= Clear.  

## 2018-04-28 NOTE — Progress Notes (Signed)
Pt's states no medical or surgical changes since previsit or office visit. 

## 2018-04-28 NOTE — Op Note (Addendum)
[place] Patient Name: Richard Baldwin Procedure Date: 04/28/2018 8:49 AM MRN: [id number] Endoscopist: Justice Britain , MD Age: 63 Referring MD:  Date of Birth: May 09, 1955 Gender: Male Account #: [id number] Procedure:                Colonoscopy Indications:              Screening for colorectal malignant neoplasm, prior                            abnormal colonoscopy with erythema and intermittent                            diarrhea Medicines:                Monitored Anesthesia Care Procedure:                Pre-Anesthesia Assessment:                           - Prior to the procedure, a History and Physical                            was performed, and patient medications and                            allergies were reviewed. The patient's tolerance of                            previous anesthesia was also reviewed. The risks                            and benefits of the procedure and the sedation                            options and risks were discussed with the patient.                            All questions were answered, and informed consent                            was obtained. Prior Anticoagulants: The patient has                            taken no previous anticoagulant or antiplatelet                            agents. ASA Grade Assessment: II - A patient with                            mild systemic disease. After reviewing the risks                            and benefits, the patient was deemed in  satisfactory condition to undergo the procedure.                           After obtaining informed consent, the colonoscope                            was passed under direct vision. Throughout the                            procedure, the patient's blood pressure, pulse, and                            oxygen saturations were monitored continuously. The                            Model CF-HQ190L [id number]) scope was  introduced                            through the anus and advanced to the 5 cm into the                            ileum. The colonoscopy was performed without                            difficulty. The patient tolerated the procedure.                            The quality of the bowel preparation was good. The                            terminal ileum, ileocecal valve, appendiceal                            orifice, and rectum were photographed. Scope In: 9:08:29 AM Scope Out: 9:35:25 AM Scope Withdrawal Time: 0 hours 22 minutes 12 seconds  Total Procedure Duration: 0 hours 26 minutes 56 seconds  Findings:                 The digital rectal exam findings include                            non-thrombosed internal hemorrhoids. Pertinent                            negatives include no palpable rectal lesions.                           The terminal ileum and ileocecal valve appeared                            normal.                           Five sessile polyps were found in the rectum (1),  transverse colon (3) and ascending colon (1). The                            polyps were 2 to 5 mm in size. These polyps were                            removed with a cold snare. Resection and retrieval                            were complete. The rectal polyp oozed for                            approximately 5-minutes. For hemostasis, two                            hemostatic clips were successfully placed (MR                            conditional) on the rectal polyp. There was no                            bleeding at the end of the procedure.                           Many small and large-mouthed diverticula were found                            in the entire colon.                           Normal mucosa was found in the entire colon                            otherwise. Biopsies for histology were taken with a                            cold forceps from the cecum,  ascending colon,                            transverse colon, descending colon, sigmoid colon,                            rectum and rectosigmoid colon for evaluation of                            microscopic colitis.                           Non-bleeding non-thrombosed internal hemorrhoids                            were found during retroflexion, during perianal  exam and during digital exam. The hemorrhoids were                            Grade II (internal hemorrhoids that prolapse but                            reduce spontaneously). Complications:            No immediate complications. Estimated Blood Loss:     Estimated blood loss was minimal. Impression:               - Non-thrombosed internal hemorrhoids found on                            digital rectal exam.                           - The examined portion of the ileum was normal.                           - Five 2 to 5 mm polyps in the rectum, in the                            transverse colon and in the ascending colon,                            removed with a cold snare. Resected and retrieved.                            Clips (MR conditional) were placed on rectal                            polypectomy site due to oozing.                           - Diverticulosis in the entire examined colon.                           - Normal mucosa in the entire examined colon                            otherwise. Biopsied for [facility] rule out.                           - Non-bleeding non-thrombosed internal hemorrhoids. Recommendation:           - The patient will be observed post-procedure,                            until all discharge criteria are met.                           - Discharge patient to home.                           - Patient has a  contact number available for                            emergencies. The signs and symptoms of potential                            delayed complications were  discussed with the                            patient. Return to normal activities tomorrow.                            Written discharge instructions were provided to the                            patient.                           - High fiber diet.                           - Await pathology results.                           - Continue present medications.                           - Repeat colonoscopy in 3 - 5 years for                            surveillance based on pathology results and                            findings of adenomatous tissue.                           - The findings and recommendations were discussed                            with the patient.                           - The findings and recommendations were discussed                            with the patient's family. Justice Britain, MD 04/28/2018 9:47:40 AM

## 2018-04-28 NOTE — Progress Notes (Signed)
Called to room to assist during endoscopic procedure.  Patient ID and intended procedure confirmed with present staff. Received instructions for my participation in the procedure from the performing physician.  

## 2018-04-29 ENCOUNTER — Telehealth (HOSPITAL_COMMUNITY): Payer: Self-pay

## 2018-04-29 ENCOUNTER — Telehealth: Payer: Self-pay

## 2018-04-29 NOTE — Telephone Encounter (Signed)
Encounter complete. 

## 2018-04-29 NOTE — Telephone Encounter (Signed)
  Follow up Call-  Call back number 04/28/2018  Post procedure Call Back phone  # [phone]  Permission to leave phone message Yes  Some recent data might be hidden     Patient questions:  Do you have a fever, pain , or abdominal swelling? No. Pain Score  0 *  Have you tolerated food without any problems? Yes.    Have you been able to return to your normal activities? Yes.    Do you have any questions about your discharge instructions: Diet   No. Medications  No. Follow up visit  No.  Do you have questions or concerns about your Care? No.  Actions: * If pain score is 4 or above: No action needed, pain <4.

## 2018-05-02 ENCOUNTER — Encounter: Payer: Self-pay | Admitting: Gastroenterology

## 2018-05-04 ENCOUNTER — Other Ambulatory Visit (INDEPENDENT_AMBULATORY_CARE_PROVIDER_SITE_OTHER): Payer: BLUE CROSS/BLUE SHIELD

## 2018-05-04 ENCOUNTER — Ambulatory Visit (HOSPITAL_COMMUNITY)
Admission: RE | Admit: 2018-05-04 | Discharge: 2018-05-04 | Disposition: A | Discharge status: Home / Self Care | Payer: BLUE CROSS/BLUE SHIELD | Source: Ambulatory Visit | Attending: Cardiology | Admitting: Cardiology

## 2018-05-04 ENCOUNTER — Other Ambulatory Visit: Payer: Self-pay

## 2018-05-04 ENCOUNTER — Encounter: Payer: Self-pay | Admitting: *Deleted

## 2018-05-04 DIAGNOSIS — I2510 Atherosclerotic heart disease of native coronary artery without angina pectoris: Secondary | ICD-10-CM

## 2018-05-04 DIAGNOSIS — I10 Essential (primary) hypertension: Secondary | ICD-10-CM | POA: Diagnosis not present

## 2018-05-04 DIAGNOSIS — E7800 Pure hypercholesterolemia, unspecified: Secondary | ICD-10-CM

## 2018-05-04 LAB — LIPID PANEL
Cholesterol: 144 mg/dL (ref 0–200)
HDL: 51.9 mg/dL (ref >39.00)
NonHDL: 92.34
Total CHOL/HDL Ratio: 3
Triglycerides: 204 mg/dL — ABNORMAL HIGH (ref 0.0–149.0)
VLDL: 40.8 mg/dL — ABNORMAL HIGH (ref 0.0–40.0)

## 2018-05-04 LAB — LDL CHOLESTEROL, DIRECT: Direct LDL: 74 mg/dL

## 2018-05-04 LAB — HEPATIC FUNCTION PANEL
ALT: 33 U/L (ref 0–53)
AST: 25 U/L (ref 0–37)
Albumin: 4.7 g/dL (ref 3.5–5.2)
Alkaline Phosphatase: 54 U/L (ref 39–117)
Bilirubin, Direct: 0.1 mg/dL (ref 0.0–0.3)
Total Bilirubin: 0.5 mg/dL (ref 0.2–1.2)
Total Protein: 7.3 g/dL (ref 6.0–8.3)

## 2018-05-04 LAB — BASIC METABOLIC PANEL
BUN: 24 mg/dL — ABNORMAL HIGH (ref 6–23)
CO2: 27 mEq/L (ref 19–32)
Calcium: 10 mg/dL (ref 8.4–10.5)
Chloride: 100 mEq/L (ref 96–112)
Creatinine, Ser: 1 mg/dL (ref 0.40–1.50)
GFR: 76.17 mL/min (ref >60.00)
Glucose, Bld: 105 mg/dL — ABNORMAL HIGH (ref 70–99)
Potassium: 4.5 mEq/L (ref 3.5–5.1)
SODIUM: 136 meq/L (ref 135–145)

## 2018-05-04 LAB — EXERCISE TOLERANCE TEST
CHL CUP RESTING HR STRESS: 90 {beats}/min
Estimated workload: 10.6 METS
Exercise duration (min): 9 min
Exercise duration (sec): 22 s
MPHR: 160 {beats}/min
Peak HR: 157 {beats}/min
Percent HR: 98 %
RPE: 19

## 2018-05-06 ENCOUNTER — Encounter (HOSPITAL_COMMUNITY): Payer: BLUE CROSS/BLUE SHIELD

## 2018-10-21 ENCOUNTER — Other Ambulatory Visit: Payer: Self-pay

## 2018-10-21 ENCOUNTER — Ambulatory Visit (INDEPENDENT_AMBULATORY_CARE_PROVIDER_SITE_OTHER): Payer: BC Managed Care – PPO

## 2018-10-21 DIAGNOSIS — Z23 Encounter for immunization: Secondary | ICD-10-CM

## 2019-01-25 ENCOUNTER — Other Ambulatory Visit: Payer: Self-pay | Admitting: *Deleted

## 2019-01-25 MED ORDER — ROSUVASTATIN CALCIUM 40 MG PO TABS: 40.0000 mg | ORAL_TABLET | Freq: Every day | ORAL | 0 refills | Status: AC

## 2019-02-09 ENCOUNTER — Other Ambulatory Visit: Payer: Self-pay | Admitting: Family Medicine

## 2019-02-13 ENCOUNTER — Other Ambulatory Visit: Payer: BLUE CROSS/BLUE SHIELD

## 2019-02-20 ENCOUNTER — Encounter: Payer: BLUE CROSS/BLUE SHIELD | Admitting: Family Medicine

## 2019-04-04 ENCOUNTER — Telehealth: Payer: Self-pay | Admitting: *Deleted

## 2019-04-04 NOTE — Telephone Encounter (Signed)
A message was left, re: his follow up visit. 

## 2019-06-13 ENCOUNTER — Other Ambulatory Visit: Payer: Self-pay | Admitting: Cardiology

## 2019-06-19 ENCOUNTER — Other Ambulatory Visit: Payer: Self-pay | Admitting: Cardiology
